# Patient Record
Sex: Male | Born: 1950 | Race: White | Hispanic: No | Marital: Married | Smoking: Never smoker
Health system: Southern US, Community
[De-identification: ages and names within clinical notes are randomized; demographics above are authoritative.]

## PROBLEM LIST (undated history)

## (undated) DIAGNOSIS — I2510 Atherosclerotic heart disease of native coronary artery without angina pectoris: Secondary | ICD-10-CM

## (undated) DIAGNOSIS — I10 Essential (primary) hypertension: Secondary | ICD-10-CM

## (undated) DIAGNOSIS — E7800 Pure hypercholesterolemia, unspecified: Secondary | ICD-10-CM

## (undated) DIAGNOSIS — Z8249 Family history of ischemic heart disease and other diseases of the circulatory system: Secondary | ICD-10-CM

## (undated) DIAGNOSIS — Z9289 Personal history of other medical treatment: Secondary | ICD-10-CM

## (undated) HISTORY — DX: Family history of ischemic heart disease and other diseases of the circulatory system: Z82.49

## (undated) HISTORY — DX: Personal history of other medical treatment: Z92.89

---

## 2000-12-01 ENCOUNTER — Encounter: Payer: Self-pay | Admitting: Family Medicine

## 2000-12-01 ENCOUNTER — Encounter: Admission: RE | Admit: 2000-12-01 | Discharge: 2000-12-01 | Payer: Self-pay | Admitting: Family Medicine

## 2003-10-04 ENCOUNTER — Ambulatory Visit (HOSPITAL_COMMUNITY): Admission: RE | Admit: 2003-10-04 | Discharge: 2003-10-04 | Payer: Self-pay | Admitting: Gastroenterology

## 2003-10-04 ENCOUNTER — Encounter (INDEPENDENT_AMBULATORY_CARE_PROVIDER_SITE_OTHER): Payer: Self-pay | Admitting: Specialist

## 2004-11-23 ENCOUNTER — Encounter: Admission: RE | Admit: 2004-11-23 | Discharge: 2004-11-23 | Payer: Self-pay | Admitting: Family Medicine

## 2006-06-07 HISTORY — PX: KNEE SURGERY: SHX244

## 2009-10-01 ENCOUNTER — Inpatient Hospital Stay (HOSPITAL_COMMUNITY): Admission: EM | Admit: 2009-10-01 | Discharge: 2009-10-09 | Payer: Self-pay | Admitting: Emergency Medicine

## 2009-10-01 DIAGNOSIS — Z9289 Personal history of other medical treatment: Secondary | ICD-10-CM

## 2009-10-01 HISTORY — DX: Personal history of other medical treatment: Z92.89

## 2009-10-02 ENCOUNTER — Ambulatory Visit: Payer: Self-pay | Admitting: Thoracic Surgery (Cardiothoracic Vascular Surgery)

## 2009-10-02 ENCOUNTER — Encounter: Payer: Self-pay | Admitting: Thoracic Surgery (Cardiothoracic Vascular Surgery)

## 2009-10-03 ENCOUNTER — Encounter: Payer: Self-pay | Admitting: Thoracic Surgery (Cardiothoracic Vascular Surgery)

## 2009-10-04 ENCOUNTER — Encounter: Payer: Self-pay | Admitting: Thoracic Surgery (Cardiothoracic Vascular Surgery)

## 2009-10-04 HISTORY — PX: TRANSTHORACIC ECHOCARDIOGRAM: SHX275

## 2009-10-04 HISTORY — PX: CORONARY ARTERY BYPASS GRAFT: SHX141

## 2009-10-14 ENCOUNTER — Ambulatory Visit: Payer: Self-pay | Admitting: Thoracic Surgery (Cardiothoracic Vascular Surgery)

## 2009-10-20 ENCOUNTER — Ambulatory Visit: Payer: Self-pay | Admitting: Thoracic Surgery (Cardiothoracic Vascular Surgery)

## 2009-10-20 ENCOUNTER — Encounter
Admission: RE | Admit: 2009-10-20 | Discharge: 2009-10-20 | Payer: Self-pay | Admitting: Thoracic Surgery (Cardiothoracic Vascular Surgery)

## 2009-10-23 ENCOUNTER — Encounter (HOSPITAL_COMMUNITY): Admission: RE | Admit: 2009-10-23 | Discharge: 2010-01-21 | Payer: Self-pay | Admitting: Cardiology

## 2009-11-10 ENCOUNTER — Encounter
Admission: RE | Admit: 2009-11-10 | Discharge: 2009-11-10 | Payer: Self-pay | Admitting: Thoracic Surgery (Cardiothoracic Vascular Surgery)

## 2009-11-10 ENCOUNTER — Ambulatory Visit: Payer: Self-pay | Admitting: Thoracic Surgery (Cardiothoracic Vascular Surgery)

## 2010-01-22 ENCOUNTER — Encounter (HOSPITAL_COMMUNITY): Admission: RE | Admit: 2010-01-22 | Discharge: 2010-02-05 | Payer: Self-pay | Admitting: Cardiology

## 2010-07-13 ENCOUNTER — Other Ambulatory Visit: Payer: Self-pay | Admitting: Dermatology

## 2010-08-25 LAB — HEMOGLOBIN A1C
Hgb A1c MFr Bld: 5.9 % — ABNORMAL HIGH (ref <5.7)
Mean Plasma Glucose: 123 mg/dL — ABNORMAL HIGH (ref <117)

## 2010-08-25 LAB — GLUCOSE, CAPILLARY
Glucose-Capillary: 103 mg/dL — ABNORMAL HIGH (ref 70–99)
Glucose-Capillary: 107 mg/dL — ABNORMAL HIGH (ref 70–99)
Glucose-Capillary: 110 mg/dL — ABNORMAL HIGH (ref 70–99)
Glucose-Capillary: 114 mg/dL — ABNORMAL HIGH (ref 70–99)
Glucose-Capillary: 115 mg/dL — ABNORMAL HIGH (ref 70–99)
Glucose-Capillary: 126 mg/dL — ABNORMAL HIGH (ref 70–99)
Glucose-Capillary: 128 mg/dL — ABNORMAL HIGH (ref 70–99)
Glucose-Capillary: 129 mg/dL — ABNORMAL HIGH (ref 70–99)
Glucose-Capillary: 135 mg/dL — ABNORMAL HIGH (ref 70–99)
Glucose-Capillary: 143 mg/dL — ABNORMAL HIGH (ref 70–99)
Glucose-Capillary: 149 mg/dL — ABNORMAL HIGH (ref 70–99)
Glucose-Capillary: 122 mg/dL — ABNORMAL HIGH (ref 70–99)
Glucose-Capillary: 135 mg/dL — ABNORMAL HIGH (ref 70–99)
Glucose-Capillary: 145 mg/dL — ABNORMAL HIGH (ref 70–99)

## 2010-08-25 LAB — BLOOD GAS, ARTERIAL
Acid-Base Excess: 2.4 mmol/L — ABNORMAL HIGH (ref 0.0–2.0)
Bicarbonate: 26.7 mEq/L — ABNORMAL HIGH (ref 20.0–24.0)
FIO2: 0.21 %
O2 Saturation: 96.7 %
Patient temperature: 98.6
TCO2: 28 mmol/L (ref 0–100)
pCO2 arterial: 43.3 mmHg (ref 35.0–45.0)
pH, Arterial: 7.407 (ref 7.350–7.450)
pO2, Arterial: 78.5 mmHg — ABNORMAL LOW (ref 80.0–100.0)

## 2010-08-25 LAB — BASIC METABOLIC PANEL
BUN: 10 mg/dL (ref 6–23)
BUN: 17 mg/dL (ref 6–23)
BUN: 17 mg/dL (ref 6–23)
BUN: 16 mg/dL (ref 6–23)
BUN: 17 mg/dL (ref 6–23)
CO2: 27 mEq/L (ref 19–32)
CO2: 32 mEq/L (ref 19–32)
CO2: 32 mEq/L (ref 19–32)
CO2: 30 mEq/L (ref 19–32)
CO2: 30 mEq/L (ref 19–32)
Calcium: 7.5 mg/dL — ABNORMAL LOW (ref 8.4–10.5)
Calcium: 8.2 mg/dL — ABNORMAL LOW (ref 8.4–10.5)
Calcium: 8.3 mg/dL — ABNORMAL LOW (ref 8.4–10.5)
Calcium: 8.9 mg/dL (ref 8.4–10.5)
Calcium: 9.4 mg/dL (ref 8.4–10.5)
Chloride: 101 mEq/L (ref 96–112)
Chloride: 104 mEq/L (ref 96–112)
Chloride: 105 mEq/L (ref 96–112)
Chloride: 102 mEq/L (ref 96–112)
Chloride: 104 mEq/L (ref 96–112)
Creatinine, Ser: 0.84 mg/dL (ref 0.4–1.5)
Creatinine, Ser: 0.91 mg/dL (ref 0.4–1.5)
Creatinine, Ser: 0.93 mg/dL (ref 0.4–1.5)
Creatinine, Ser: 0.95 mg/dL (ref 0.4–1.5)
Creatinine, Ser: 1 mg/dL (ref 0.4–1.5)
GFR calc Af Amer: >60 mL/min (ref >60)
GFR calc Af Amer: >60 mL/min (ref >60)
GFR calc Af Amer: >60 mL/min (ref >60)
GFR calc Af Amer: >60 mL/min (ref >60)
GFR calc Af Amer: >60 mL/min (ref >60)
GFR calc non Af Amer: >60 mL/min (ref >60)
GFR calc non Af Amer: >60 mL/min (ref >60)
GFR calc non Af Amer: >60 mL/min (ref >60)
GFR calc non Af Amer: >60 mL/min (ref >60)
GFR calc non Af Amer: >60 mL/min (ref >60)
Glucose, Bld: 108 mg/dL — ABNORMAL HIGH (ref 70–99)
Glucose, Bld: 114 mg/dL — ABNORMAL HIGH (ref 70–99)
Glucose, Bld: 161 mg/dL — ABNORMAL HIGH (ref 70–99)
Glucose, Bld: 104 mg/dL — ABNORMAL HIGH (ref 70–99)
Glucose, Bld: 108 mg/dL — ABNORMAL HIGH (ref 70–99)
Potassium: 4.1 mEq/L (ref 3.5–5.1)
Potassium: 4.3 mEq/L (ref 3.5–5.1)
Potassium: 4.6 mEq/L (ref 3.5–5.1)
Potassium: 3.4 mEq/L — ABNORMAL LOW (ref 3.5–5.1)
Potassium: 4 mEq/L (ref 3.5–5.1)
Sodium: 135 mEq/L (ref 135–145)
Sodium: 137 mEq/L (ref 135–145)
Sodium: 140 mEq/L (ref 135–145)
Sodium: 139 mEq/L (ref 135–145)
Sodium: 139 mEq/L (ref 135–145)

## 2010-08-25 LAB — CBC
HCT: 22.1 % — ABNORMAL LOW (ref 39.0–52.0)
HCT: 22.5 % — ABNORMAL LOW (ref 39.0–52.0)
HCT: 22.7 % — ABNORMAL LOW (ref 39.0–52.0)
HCT: 25 % — ABNORMAL LOW (ref 39.0–52.0)
HCT: 16.5 % — ABNORMAL LOW (ref 39.0–52.0)
HCT: 22.3 % — ABNORMAL LOW (ref 39.0–52.0)
HCT: 37.4 % — ABNORMAL LOW (ref 39.0–52.0)
HCT: 39.6 % (ref 39.0–52.0)
Hemoglobin: 7.8 g/dL — ABNORMAL LOW (ref 13.0–17.0)
Hemoglobin: 7.8 g/dL — ABNORMAL LOW (ref 13.0–17.0)
Hemoglobin: 7.9 g/dL — ABNORMAL LOW (ref 13.0–17.0)
Hemoglobin: 8.7 g/dL — ABNORMAL LOW (ref 13.0–17.0)
Hemoglobin: 13.1 g/dL (ref 13.0–17.0)
Hemoglobin: 13.9 g/dL (ref 13.0–17.0)
Hemoglobin: 5.9 g/dL — CL (ref 13.0–17.0)
Hemoglobin: 7.7 g/dL — ABNORMAL LOW (ref 13.0–17.0)
MCHC: 34.5 g/dL (ref 30.0–36.0)
MCHC: 34.7 g/dL (ref 30.0–36.0)
MCHC: 35.1 g/dL (ref 30.0–36.0)
MCHC: 35.3 g/dL (ref 30.0–36.0)
MCHC: 34.5 g/dL (ref 30.0–36.0)
MCHC: 35 g/dL (ref 30.0–36.0)
MCHC: 35.2 g/dL (ref 30.0–36.0)
MCHC: 35.7 g/dL (ref 30.0–36.0)
MCV: 90.8 fL (ref 78.0–100.0)
MCV: 91.8 fL (ref 78.0–100.0)
MCV: 92.2 fL (ref 78.0–100.0)
MCV: 92.5 fL (ref 78.0–100.0)
MCV: 91.7 fL (ref 78.0–100.0)
MCV: 93.2 fL (ref 78.0–100.0)
MCV: 93.9 fL (ref 78.0–100.0)
MCV: 94 fL (ref 78.0–100.0)
Platelets: 126 10*3/uL — ABNORMAL LOW (ref 150–400)
Platelets: 135 10*3/uL — ABNORMAL LOW (ref 150–400)
Platelets: 144 10*3/uL — ABNORMAL LOW (ref 150–400)
Platelets: 151 10*3/uL (ref 150–400)
Platelets: 136 10*3/uL — ABNORMAL LOW (ref 150–400)
Platelets: 197 10*3/uL (ref 150–400)
Platelets: 222 10*3/uL (ref 150–400)
Platelets: 84 10*3/uL — ABNORMAL LOW (ref 150–400)
RBC: 2.44 MIL/uL — ABNORMAL LOW (ref 4.22–5.81)
RBC: 2.44 MIL/uL — ABNORMAL LOW (ref 4.22–5.81)
RBC: 2.45 MIL/uL — ABNORMAL LOW (ref 4.22–5.81)
RBC: 2.73 MIL/uL — ABNORMAL LOW (ref 4.22–5.81)
RBC: 1.77 MIL/uL — ABNORMAL LOW (ref 4.22–5.81)
RBC: 2.44 MIL/uL — ABNORMAL LOW (ref 4.22–5.81)
RBC: 3.97 MIL/uL — ABNORMAL LOW (ref 4.22–5.81)
RBC: 4.22 MIL/uL (ref 4.22–5.81)
RDW: 14.2 % (ref 11.5–15.5)
RDW: 14.2 % (ref 11.5–15.5)
RDW: 14.3 % (ref 11.5–15.5)
RDW: 14.3 % (ref 11.5–15.5)
RDW: 12.3 % (ref 11.5–15.5)
RDW: 12.5 % (ref 11.5–15.5)
RDW: 12.6 % (ref 11.5–15.5)
RDW: 14.1 % (ref 11.5–15.5)
WBC: 11.7 10*3/uL — ABNORMAL HIGH (ref 4.0–10.5)
WBC: 11.8 10*3/uL — ABNORMAL HIGH (ref 4.0–10.5)
WBC: 12.1 10*3/uL — ABNORMAL HIGH (ref 4.0–10.5)
WBC: 14.8 10*3/uL — ABNORMAL HIGH (ref 4.0–10.5)
WBC: 7.2 10*3/uL (ref 4.0–10.5)
WBC: 8.2 10*3/uL (ref 4.0–10.5)
WBC: 8.3 10*3/uL (ref 4.0–10.5)
WBC: 8.5 10*3/uL (ref 4.0–10.5)

## 2010-08-25 LAB — POCT I-STAT 3, ART BLOOD GAS (G3+)
Acid-Base Excess: 2 mmol/L (ref 0.0–2.0)
Acid-Base Excess: 3 mmol/L — ABNORMAL HIGH (ref 0.0–2.0)
Acid-base deficit: 3 mmol/L — ABNORMAL HIGH (ref 0.0–2.0)
Bicarbonate: 22.2 mEq/L (ref 20.0–24.0)
Bicarbonate: 24.4 mEq/L — ABNORMAL HIGH (ref 20.0–24.0)
Bicarbonate: 24.8 mEq/L — ABNORMAL HIGH (ref 20.0–24.0)
Bicarbonate: 26.1 mEq/L — ABNORMAL HIGH (ref 20.0–24.0)
Bicarbonate: 27.5 mEq/L — ABNORMAL HIGH (ref 20.0–24.0)
O2 Saturation: 100 %
O2 Saturation: 100 %
O2 Saturation: 100 %
O2 Saturation: 95 %
O2 Saturation: 99 %
Patient temperature: 34.1
Patient temperature: 37.1
Patient temperature: 99
TCO2: 23 mmol/L (ref 0–100)
TCO2: 26 mmol/L (ref 0–100)
TCO2: 26 mmol/L (ref 0–100)
TCO2: 27 mmol/L (ref 0–100)
TCO2: 29 mmol/L (ref 0–100)
pCO2 arterial: 34.7 mmHg — ABNORMAL LOW (ref 35.0–45.0)
pCO2 arterial: 38.2 mmHg (ref 35.0–45.0)
pCO2 arterial: 38.7 mmHg (ref 35.0–45.0)
pCO2 arterial: 40.4 mmHg (ref 35.0–45.0)
pCO2 arterial: 44.3 mmHg (ref 35.0–45.0)
pH, Arterial: 7.366 (ref 7.350–7.450)
pH, Arterial: 7.401 (ref 7.350–7.450)
pH, Arterial: 7.415 (ref 7.350–7.450)
pH, Arterial: 7.419 (ref 7.350–7.450)
pH, Arterial: 7.449 (ref 7.350–7.450)
pO2, Arterial: 150 mmHg — ABNORMAL HIGH (ref 80.0–100.0)
pO2, Arterial: 159 mmHg — ABNORMAL HIGH (ref 80.0–100.0)
pO2, Arterial: 185 mmHg — ABNORMAL HIGH (ref 80.0–100.0)
pO2, Arterial: 293 mmHg — ABNORMAL HIGH (ref 80.0–100.0)
pO2, Arterial: 80 mmHg (ref 80.0–100.0)

## 2010-08-25 LAB — POCT I-STAT 4, (NA,K, GLUC, HGB,HCT)
Glucose, Bld: 105 mg/dL — ABNORMAL HIGH (ref 70–99)
Glucose, Bld: 105 mg/dL — ABNORMAL HIGH (ref 70–99)
Glucose, Bld: 110 mg/dL — ABNORMAL HIGH (ref 70–99)
Glucose, Bld: 111 mg/dL — ABNORMAL HIGH (ref 70–99)
Glucose, Bld: 119 mg/dL — ABNORMAL HIGH (ref 70–99)
Glucose, Bld: 74 mg/dL (ref 70–99)
Glucose, Bld: 76 mg/dL (ref 70–99)
Glucose, Bld: 96 mg/dL (ref 70–99)
HCT: 14 % — ABNORMAL LOW (ref 39.0–52.0)
HCT: 15 % — ABNORMAL LOW (ref 39.0–52.0)
HCT: 23 % — ABNORMAL LOW (ref 39.0–52.0)
HCT: 26 % — ABNORMAL LOW (ref 39.0–52.0)
HCT: 26 % — ABNORMAL LOW (ref 39.0–52.0)
HCT: 26 % — ABNORMAL LOW (ref 39.0–52.0)
HCT: 31 % — ABNORMAL LOW (ref 39.0–52.0)
HCT: 34 % — ABNORMAL LOW (ref 39.0–52.0)
Hemoglobin: 10.5 g/dL — ABNORMAL LOW (ref 13.0–17.0)
Hemoglobin: 11.6 g/dL — ABNORMAL LOW (ref 13.0–17.0)
Hemoglobin: 4.8 g/dL — CL (ref 13.0–17.0)
Hemoglobin: 5.1 g/dL — CL (ref 13.0–17.0)
Hemoglobin: 7.8 g/dL — ABNORMAL LOW (ref 13.0–17.0)
Hemoglobin: 8.8 g/dL — ABNORMAL LOW (ref 13.0–17.0)
Hemoglobin: 8.8 g/dL — ABNORMAL LOW (ref 13.0–17.0)
Hemoglobin: 8.8 g/dL — ABNORMAL LOW (ref 13.0–17.0)
Potassium: 3.7 mEq/L (ref 3.5–5.1)
Potassium: 3.7 mEq/L (ref 3.5–5.1)
Potassium: 4 mEq/L (ref 3.5–5.1)
Potassium: 4.1 mEq/L (ref 3.5–5.1)
Potassium: 4.3 mEq/L (ref 3.5–5.1)
Potassium: 4.4 mEq/L (ref 3.5–5.1)
Potassium: 5.3 mEq/L — ABNORMAL HIGH (ref 3.5–5.1)
Potassium: 6 mEq/L — ABNORMAL HIGH (ref 3.5–5.1)
Sodium: 133 mEq/L — ABNORMAL LOW (ref 135–145)
Sodium: 134 mEq/L — ABNORMAL LOW (ref 135–145)
Sodium: 136 mEq/L (ref 135–145)
Sodium: 138 mEq/L (ref 135–145)
Sodium: 139 mEq/L (ref 135–145)
Sodium: 140 mEq/L (ref 135–145)
Sodium: 143 mEq/L (ref 135–145)
Sodium: 143 mEq/L (ref 135–145)

## 2010-08-25 LAB — POCT I-STAT, CHEM 8
BUN: 15 mg/dL (ref 6–23)
BUN: 10 mg/dL (ref 6–23)
Calcium, Ion: 0.93 mmol/L — ABNORMAL LOW (ref 1.12–1.32)
Calcium, Ion: 1.11 mmol/L — ABNORMAL LOW (ref 1.12–1.32)
Chloride: 103 mEq/L (ref 96–112)
Chloride: 106 mEq/L (ref 96–112)
Creatinine, Ser: 0.8 mg/dL (ref 0.4–1.5)
Creatinine, Ser: 0.8 mg/dL (ref 0.4–1.5)
Glucose, Bld: 140 mg/dL — ABNORMAL HIGH (ref 70–99)
Glucose, Bld: 135 mg/dL — ABNORMAL HIGH (ref 70–99)
HCT: 26 % — ABNORMAL LOW (ref 39.0–52.0)
HCT: 22 % — ABNORMAL LOW (ref 39.0–52.0)
Hemoglobin: 8.8 g/dL — ABNORMAL LOW (ref 13.0–17.0)
Hemoglobin: 7.5 g/dL — ABNORMAL LOW (ref 13.0–17.0)
Potassium: 5 mEq/L (ref 3.5–5.1)
Potassium: 4 mEq/L (ref 3.5–5.1)
Sodium: 140 mEq/L (ref 135–145)
Sodium: 142 mEq/L (ref 135–145)
TCO2: 26 mmol/L (ref 0–100)
TCO2: 27 mmol/L (ref 0–100)

## 2010-08-25 LAB — PLATELET COUNT: Platelets: 155 10*3/uL (ref 150–400)

## 2010-08-25 LAB — DIFFERENTIAL
Basophils Absolute: 0 10*3/uL (ref 0.0–0.1)
Basophils Relative: 0 % (ref 0–1)
Eosinophils Absolute: 0.1 10*3/uL (ref 0.0–0.7)
Eosinophils Relative: 1 % (ref 0–5)
Lymphocytes Relative: 15 % (ref 12–46)
Lymphs Abs: 1.2 10*3/uL (ref 0.7–4.0)
Monocytes Absolute: 0.5 10*3/uL (ref 0.1–1.0)
Monocytes Relative: 6 % (ref 3–12)
Neutro Abs: 6.4 10*3/uL (ref 1.7–7.7)
Neutrophils Relative %: 78 % — ABNORMAL HIGH (ref 43–77)

## 2010-08-25 LAB — ABO/RH: ABO/RH(D): O NEG

## 2010-08-25 LAB — PREPARE FRESH FROZEN PLASMA

## 2010-08-25 LAB — HEPARIN LEVEL (UNFRACTIONATED): Heparin Unfractionated: 0.13 IU/mL — ABNORMAL LOW (ref 0.30–0.70)

## 2010-08-25 LAB — PREPARE PLATELETS

## 2010-08-25 LAB — MRSA PCR SCREENING: MRSA by PCR: NEGATIVE

## 2010-08-25 LAB — COMPREHENSIVE METABOLIC PANEL
ALT: 32 U/L (ref 0–53)
AST: 25 U/L (ref 0–37)
Albumin: 3.8 g/dL (ref 3.5–5.2)
Alkaline Phosphatase: 66 U/L (ref 39–117)
BUN: 16 mg/dL (ref 6–23)
CO2: 27 mEq/L (ref 19–32)
Calcium: 9.3 mg/dL (ref 8.4–10.5)
Chloride: 103 mEq/L (ref 96–112)
Creatinine, Ser: 0.99 mg/dL (ref 0.4–1.5)
GFR calc Af Amer: >60 mL/min (ref >60)
GFR calc non Af Amer: >60 mL/min (ref >60)
Glucose, Bld: 98 mg/dL (ref 70–99)
Potassium: 4 mEq/L (ref 3.5–5.1)
Sodium: 136 mEq/L (ref 135–145)
Total Bilirubin: 0.8 mg/dL (ref 0.3–1.2)
Total Protein: 6.9 g/dL (ref 6.0–8.3)

## 2010-08-25 LAB — TYPE AND SCREEN
ABO/RH(D): O NEG
Antibody Screen: NEGATIVE

## 2010-08-25 LAB — PROTIME-INR
INR: 0.95 (ref 0.00–1.49)
INR: 1.67 — ABNORMAL HIGH (ref 0.00–1.49)
Prothrombin Time: 12.6 seconds (ref 11.6–15.2)
Prothrombin Time: 19.6 seconds — ABNORMAL HIGH (ref 11.6–15.2)

## 2010-08-25 LAB — LIPID PANEL
Cholesterol: 164 mg/dL (ref 0–200)
HDL: 36 mg/dL — ABNORMAL LOW (ref >39)
LDL Cholesterol: 107 mg/dL — ABNORMAL HIGH (ref 0–99)
Total CHOL/HDL Ratio: 4.6 RATIO
Triglycerides: 105 mg/dL (ref <150)
VLDL: 21 mg/dL (ref 0–40)

## 2010-08-25 LAB — MAGNESIUM
Magnesium: 2.3 mg/dL (ref 1.5–2.5)
Magnesium: 2.5 mg/dL (ref 1.5–2.5)
Magnesium: 2.1 mg/dL (ref 1.5–2.5)
Magnesium: 2.6 mg/dL — ABNORMAL HIGH (ref 1.5–2.5)

## 2010-08-25 LAB — CREATININE, SERUM
Creatinine, Ser: 0.91 mg/dL (ref 0.4–1.5)
Creatinine, Ser: 0.82 mg/dL (ref 0.4–1.5)
GFR calc Af Amer: >60 mL/min (ref >60)
GFR calc Af Amer: >60 mL/min (ref >60)
GFR calc non Af Amer: >60 mL/min (ref >60)
GFR calc non Af Amer: >60 mL/min (ref >60)

## 2010-08-25 LAB — PREPARE RBC (CROSSMATCH)

## 2010-08-25 LAB — CARDIAC PANEL(CRET KIN+CKTOT+MB+TROPI)
CK, MB: 3 ng/mL (ref 0.3–4.0)
Relative Index: 2.6 — ABNORMAL HIGH (ref 0.0–2.5)
Total CK: 114 U/L (ref 7–232)
Troponin I: 0.01 ng/mL (ref 0.00–0.06)

## 2010-08-25 LAB — CK TOTAL AND CKMB (NOT AT ARMC)
CK, MB: 4.3 ng/mL — ABNORMAL HIGH (ref 0.3–4.0)
Relative Index: 2.8 — ABNORMAL HIGH (ref 0.0–2.5)
Total CK: 155 U/L (ref 7–232)

## 2010-08-25 LAB — HEMOGLOBIN AND HEMATOCRIT, BLOOD
HCT: 25.1 % — ABNORMAL LOW (ref 39.0–52.0)
Hemoglobin: 8.8 g/dL — ABNORMAL LOW (ref 13.0–17.0)

## 2010-08-25 LAB — APTT
aPTT: 27 seconds (ref 24–37)
aPTT: 44 seconds — ABNORMAL HIGH (ref 24–37)

## 2010-08-25 LAB — TSH: TSH: 0.595 u[IU]/mL (ref 0.350–4.500)

## 2010-08-25 LAB — TROPONIN I: Troponin I: 0.01 ng/mL (ref 0.00–0.06)

## 2010-10-20 NOTE — Assessment & Plan Note (Signed)
OFFICE VISIT   Patrick Stout, Patrick Stout  DOB:  05/01/1951                                        Oct 14, 2009  CHART #:  [id number]   REASON FOR OFFICE VISIT:  Swelling of the right thigh and lower  extremity.   HISTORY OF PRESENT ILLNESS:  This is a pleasant 60 year old Caucasian  male who is status post CABG x5 on October 04, 2009.  The patient had a  fairly uneventful postoperative course and he was discharged in stable  condition on Oct 09, 2009.  The patient's complaints include occasional  shortness of breath with increasing activity as well as swelling of his  right thigh and lower extremity.  The patient denies any fever, chills,  or chest pain.   PHYSICAL EXAMINATION:  General:  This is a pleasant 60 year old  Caucasian male who is accompanied by his wife, who is in no acute  distress, who is alert, oriented, and cooperative.  Vital Signs:  His  vital signs are as follows.  BP 123/70, pulse rate 84, respirations 18,  O2 sat 96% on room air, and temperature 97.7.  Cardiovascular:  Regular  rate and rhythm.  No murmurs, gallops, or rubs.  Pulmonary:  Right lung  clear, slowly decrease at the left base.  No rales, wheezes, or rhonchi.  Abdomen:  Soft and nontender.  Bowel sounds present.  Extremities:  Positive edema, right lower extremity.  No edema, left lower extremity.  Wounds:  Sternum is clean, dry, and continuing to heal.  No signs of  infection.  Right lower extremity wound is clean and dry.  Right thigh  positive for swelling and tenderness, slight warmth.  Right calf,  tenderness at saphenous harvest area.  Some ecchymosis around the right  knee and proximally.  Slight erythema of the right thigh.  The right  thigh is warm to the touch and the left inner thigh.   IMPRESSION AND PLAN:  Hematoma of the right thigh and lower extremity.  I am going to give a prescription to the patient take Augmentin 875 mg 1  tablet p.o. 2 times daily for 10 days.  He  was encouraged to keep his  right lower extremity elevated as much as possible.  He was encouraged  to finish his Lasix and potassium.  The patient states that he has been  losing approximately 2 pounds per day.  The patient encouraged to keep a  daily record of his weight and to notify the office if he has any  increased swelling or shortness of breath.  He was also instructed that  if he develops any increased tenderness, erythema, fever, chills, or has  difficulty ambulating (the patient is able to ambulate fairly well at  this time), he is to contact the office immediately.  Otherwise, he will  be seen in the office in followup in 1 week by Dr. Cornelius Moras.   Salvatore Decent. Cornelius Moras, M.D.  Electronically Signed   DZ/MEDQ  D:  10/14/2009  T:  10/15/2009  Job:  [id number]   cc:   Ritta Slot, MD

## 2010-10-20 NOTE — Assessment & Plan Note (Signed)
OFFICE VISIT   DAYSHAWN, IRIZARRY  DOB:  1950/10/07                                        November 10, 2009  CHART #:  [id number]   ADDENDUM   Chest x-ray performed today at the [facility] is  reviewed.  This demonstrates mild residual left basilar plate-like  atelectasis.  The lung fields are otherwise clear.  Atelectasis and  pleural effusions present early after surgery have now virtually  completely resolved.  This chest x-ray looks good.  All of the sternal  wires appear intact.   Salvatore Decent. Cornelius Moras, M.D.  Electronically Signed   CHO/MEDQ  D:  11/10/2009  T:  11/11/2009  Job:  [id number]

## 2010-10-20 NOTE — Assessment & Plan Note (Signed)
OFFICE VISIT   DOMONIC, KIMBALL  DOB:  December 13, 1950                                        Oct 20, 2009  CHART #:  [id number]   HISTORY OF PRESENT ILLNESS:  The patient returns for further followup  status post coronary artery bypass grafting x5 on October 04, 2009.  He  was last seen here in the office on Oct 14, 2009 by one of our physician  assistants.  At that time, he was noted to have severe swelling and  erythema of his right lower extremity related to hematoma of the right  thigh and lower leg from his endoscopic saphenous vein procurement.  He  was started on oral Augmentin and kept on Lasix as a diuretic.  He  reports that over the last week, he has gotten dramatically better.  He  is feeling better and better every day.  He denies any fevers or chills.  His wife has been keeping track of his temperature and blood pressure,  and everything looks excellent.  Weight has continued to trend down  towards normal.  The size of swelling of his thigh and lower leg has  dramatically decreased.  He still has the associated tenderness.  Overall, he is very pleased with his progress and he has no new  complaints.  The remainder of his review of systems is unremarkable.   PHYSICAL EXAMINATION:  Notable for well-appearing male with blood  pressure 117/71, pulse 67, and oxygen saturation 96% on room air.  Examination of the chest is notable for median sternotomy incision that  is healing nicely.  The sternum is stable on palpation.  Breath sounds  are clear to auscultation and symmetrical bilaterally.  No wheezes,  rales, or rhonchi are noted.  Cardiovascular exam demonstrates regular  rate and rhythm.  The abdomen is soft and nontender.  The extremities  are warm and well perfused.  The right thigh and right lower leg are  still a little bit swollen, but apparently the amount of swelling is  dramatically decreased from last week.  There is firmness all long the  endoscopic vein harvest tract consistent with a hematoma.  At present,  there is no cellulitis and there is no tenderness to palpation.  There  is no fluctuance to suggest an undrained fluid collection.  No other  abnormalities are noted and the skin incisions remain intact.   IMPRESSION:  Resolving hematoma from his right thigh and right lower leg  endoscopic vein harvest.  The patient is otherwise doing very well.   PLAN:  I have encouraged the patient to continue to gradually increase  his physical activity.  He will complete his course of oral Augmentin  and Lasix as previously prescribed.  We have made no new recommendations  today.  We will get his followup chest x-ray today and plan to see him  back for further followup in 3 weeks.   Salvatore Decent. Cornelius Moras, M.D.  Electronically Signed   CHO/MEDQ  D:  10/20/2009  T:  10/21/2009  Job:  [id number]   cc:   Ritta Slot, MD  Marjory Lies, M.D.

## 2010-10-20 NOTE — Assessment & Plan Note (Signed)
OFFICE VISIT   Patrick Stout, Patrick Stout  DOB:  1950-11-20                                        November 10, 2009  CHART #:  [id number]   HISTORY:  The patient returns for routine followup status post coronary  artery bypass grafting x5 on October 04, 2009.  His postoperative recovery  has been uncomplicated.  He was last seen here in the office on Oct 20, 2009.  At that time, he did have a hematoma in his right thigh related  to his endoscopic vein harvest.  Since then,  he has continued to  improve.  He states that he now has only mild residual soreness and he  only occasionally uses Tylenol for pain.  He reports that his breathing  has continued to gradually improve.  He has been actively participating  in the cardiac rehab program and progressing nicely.  His appetite has  been improving.  Overall, he has no complaints.   PHYSICAL EXAMINATION:  Notable for well-appearing male with blood  pressure 118/74, pulse 73, and oxygen saturation 96% on room air.  Examination of the chest reveals clear breath sounds that are  symmetrical bilaterally.  No wheezes, rales, or rhonchi are noted.  Cardiovascular exam reveals regular rate and rhythm.  No murmurs, rubs,  or gallops are noted.  The sternum is stable and the incision is healing  nicely.  The abdomen is soft and nontender.  The extremities are warm  and well perfused.  The right lower extremity edema has resolved.  The  hematoma continues to gradually resolving.  There is no sign of  cellulitis or infection.   IMPRESSION:  The patient is progressing nicely.   PLAN:  We will check a routine chest x-ray today for followup.  I have  encouraged the patient to continue to gradually increase his physical  activity as tolerated with his only limitation at this point remaining  that he refrain from heavy lifting or strenuous use of his arms or  shoulders for at  least another 2-3 months.  All of his questions have been  addressed.  We  have not changed any of his medications.  In the future, he will call  and return to see [place] as needed.   Salvatore Decent. Cornelius Moras, M.D.  Electronically Signed   CHO/MEDQ  D:  11/10/2009  T:  11/11/2009  Job:  [id number]   cc:   Ritta Slot, MD  Marjory Lies, M.D.

## 2010-10-23 NOTE — Op Note (Signed)
NAMEBERYL, BALZ                          ACCOUNT NO.:  [id number]   MEDICAL RECORD NO.:  [id number]                   PATIENT TYPE:  AMB   LOCATION:  ENDO                                 FACILITY:  [facility]   PHYSICIAN:  Anselmo Rod, M.D.               DATE OF BIRTH:  1950-11-10   DATE OF PROCEDURE:  10/04/2003  DATE OF DISCHARGE:                                 OPERATIVE REPORT   PROCEDURE PERFORMED:  Colonoscopy with snare polypectomy times one.   ENDOSCOPIST:  Charna Elizabeth, M.D.   INSTRUMENT USED:  Olympus video colonoscope.   INDICATIONS FOR PROCEDURE:  The patient is a 60 year old white male with a  history of occasional bright red blood per rectum.  Rule out colonic polyps,  masses, etc.   PREPROCEDURE PREPARATION:  Informed consent was procured from the patient.  The patient was fasted for eight hours prior to the procedure and prepped  with a bottle of magnesium citrate and a gallon of GoLYTELY the night prior  to the procedure.   PREPROCEDURE PHYSICAL:  The patient had stable vital signs.  Neck supple.  Chest clear to auscultation.  S1 and S2 regular.  Abdomen soft with normal  bowel sounds.   DESCRIPTION OF PROCEDURE:  The patient was placed in left lateral decubitus  position and sedated with 100 mg of Demerol and 10 mg of Versed in slow  incremental doses.  Once the patient was adequately sedated and maintained  on low flow oxygen and continuous cardiac monitoring, the Olympus video  colonoscope was advanced from the rectum to the cecum and terminal ileum  without difficulty.  The patient had a fairly good prep.  A small sessile  polyp was snared from the rectum.  The rest of the colonic mucosa up to the  terminal ileum appeared normal.  The appendicular orifice and ileocecal  valve were clearly visualized and photographed.  There was no evidence of  diverticulosis.  The patient tolerated the procedure well without immediate  complication.   IMPRESSION:   Small sessile polyp snared from the rectum otherwise normal  colonoscopy up to the terminal ileum.   RECOMMENDATIONS:  1. Await pathology results.  2. Avoid all nonsteroidals including aspirin for the next two weeks.  3. Repeat CRC screening depending on pathology results.  4. Outpatient follow-up is rectal bleeding recurs.                                               Anselmo Rod, M.D.    JNM/MEDQ  D:  10/04/2003  T:  10/04/2003  Job:  [id number]   cc:   Marjory Lies, M.D.  [address]  [place]  [place] [address]  Fax: [phone]

## 2012-01-06 IMAGING — CR DG CHEST 1V PORT
1 series · 1 of 1 positions shown · non-contrast
Comparison: 10/05/2009

CLINICAL DATA: Acute coronary syndrome

PORTABLE CHEST - 1 VIEW

[view not recorded]
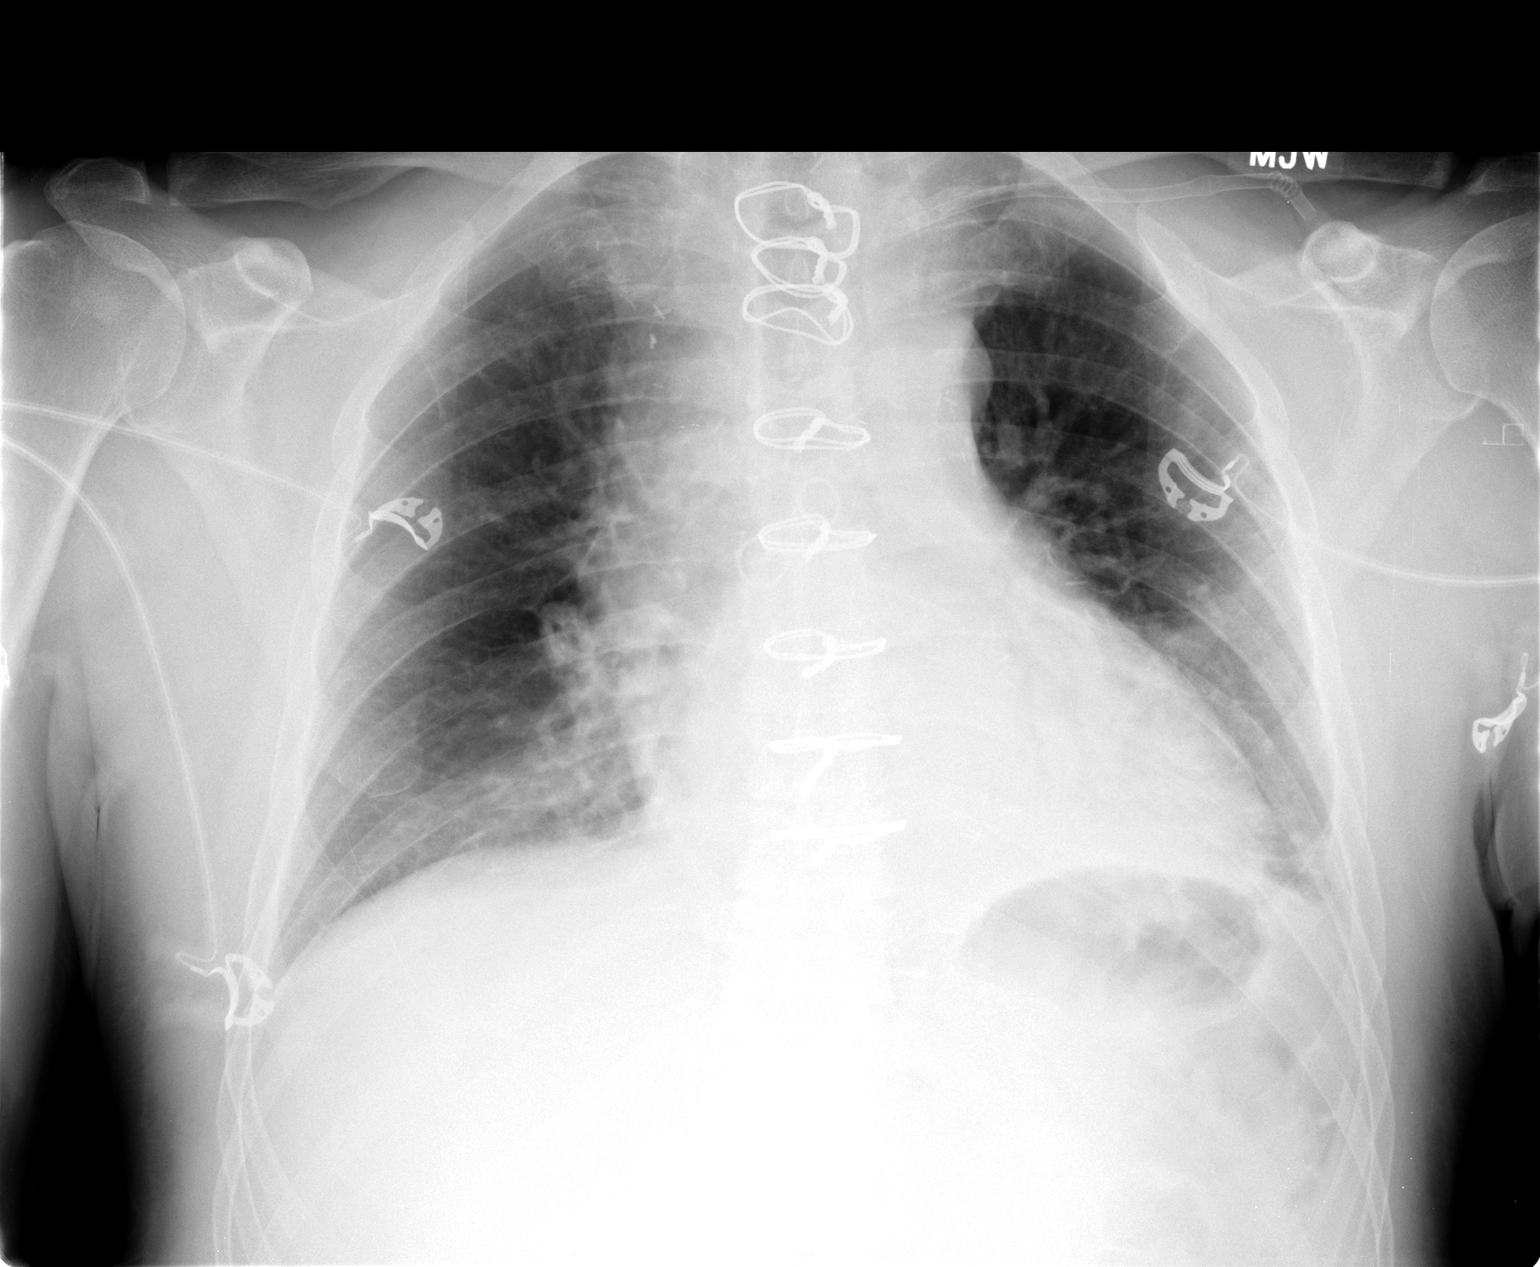

[1 of 1 positions shown; findings below may reference images not displayed]

FINDINGS: Cardiomediastinal silhouette is stable.  The patient is
status post CABG.  Right chest tube and left Swan-Ganz catheter has
been removed.  Trace left pleural effusion with left basilar
atelectasis or infiltrate.  Minimal central vascular congestion
without pulmonary edema.  No segmental infiltrate.  No diagnostic
pneumothorax.  There is  left subclavian catheter sheath in place.
IMPRESSION: Right chest tube and left Swan-Ganz catheter has been removed.
Trace left pleural effusion with left basilar atelectasis or
infiltrate.  Minimal central vascular congestion without pulmonary
edema.  No segmental infiltrate.  No diagnostic pneumothorax.
There is  left subclavian catheter sheath in place.

## 2012-01-07 IMAGING — CR DG CHEST 2V
2 series · 2 of 2 positions shown · non-contrast
Comparison: 10/06/2009.

CLINICAL DATA: CABG.

CHEST - 2 VIEW

[w chest pa]
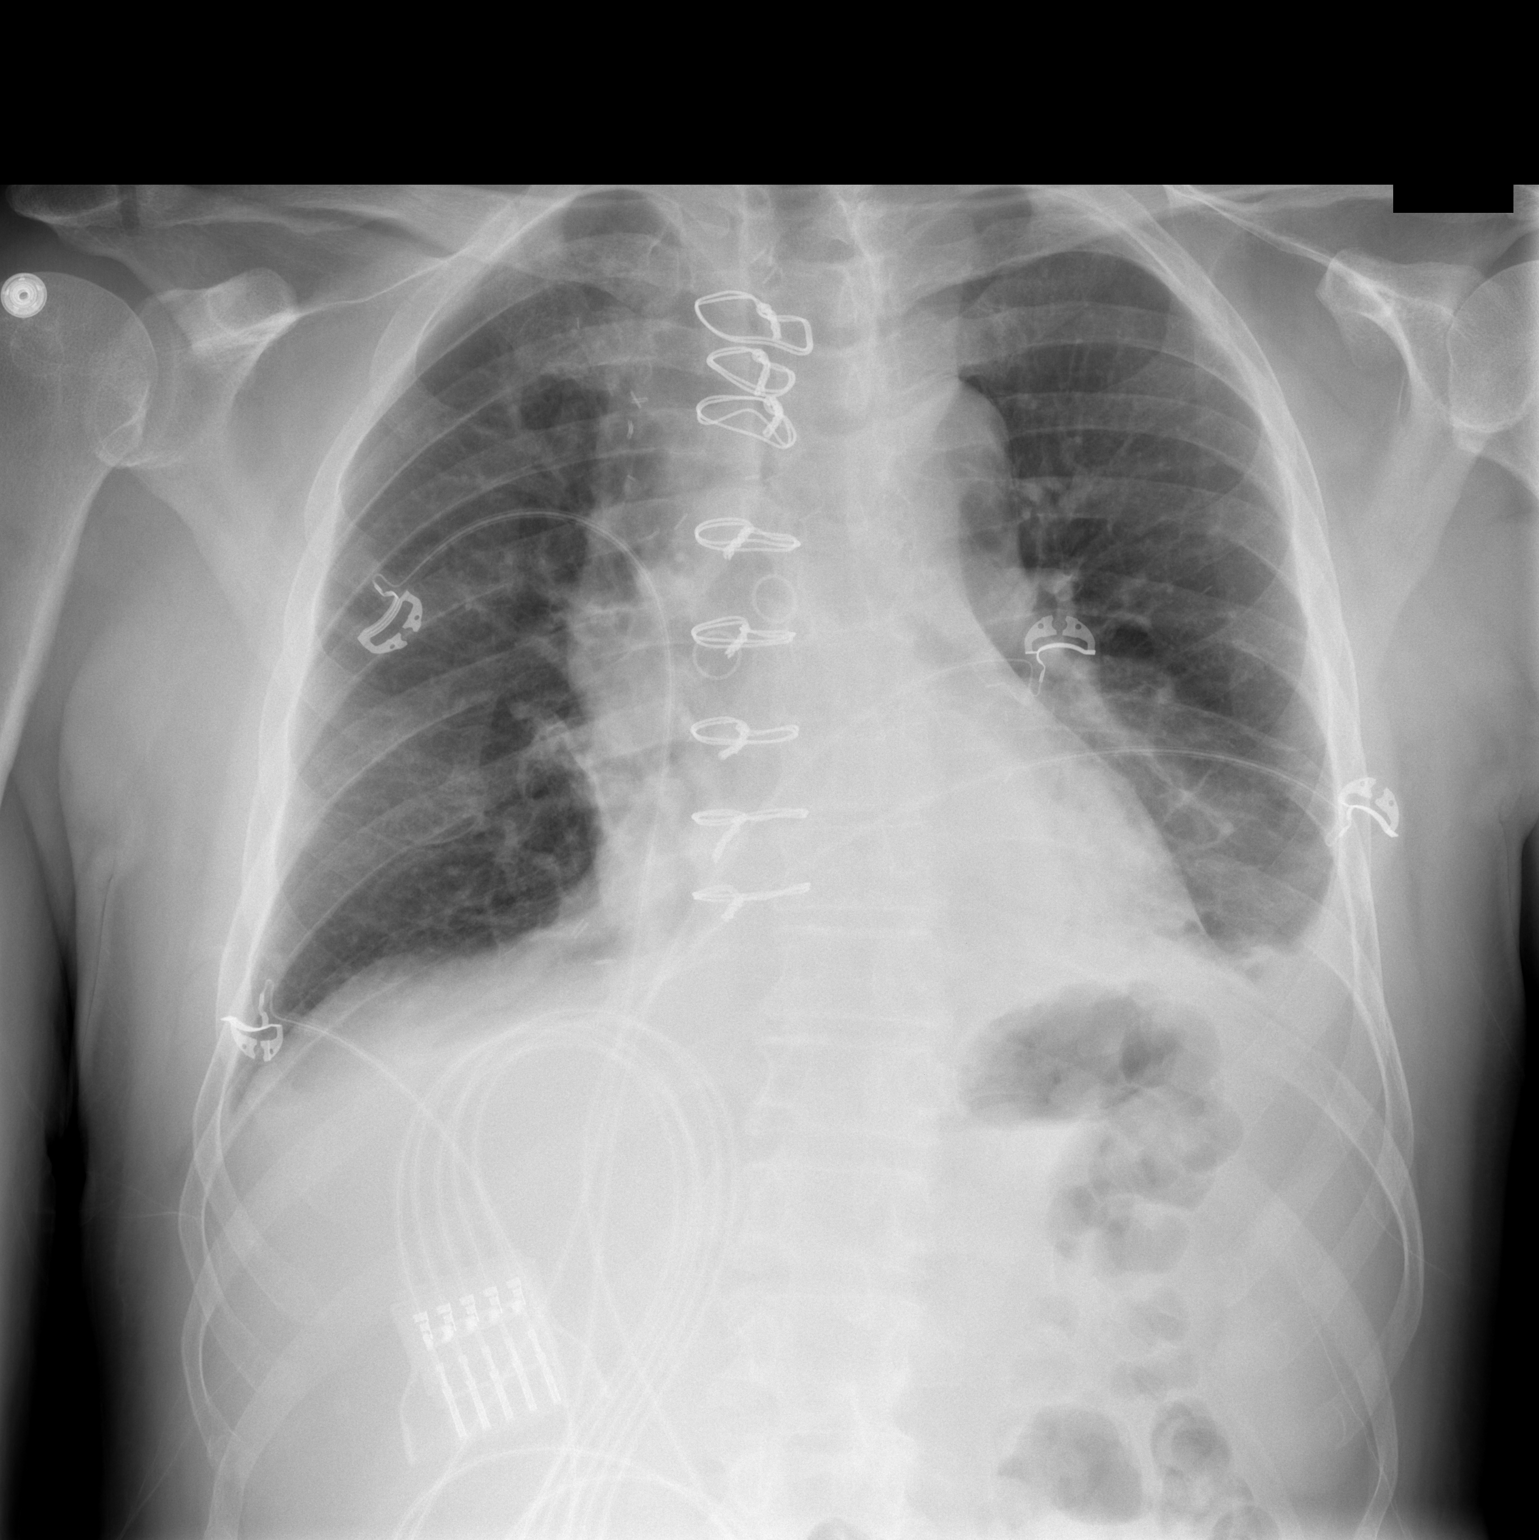

[w chest lat]
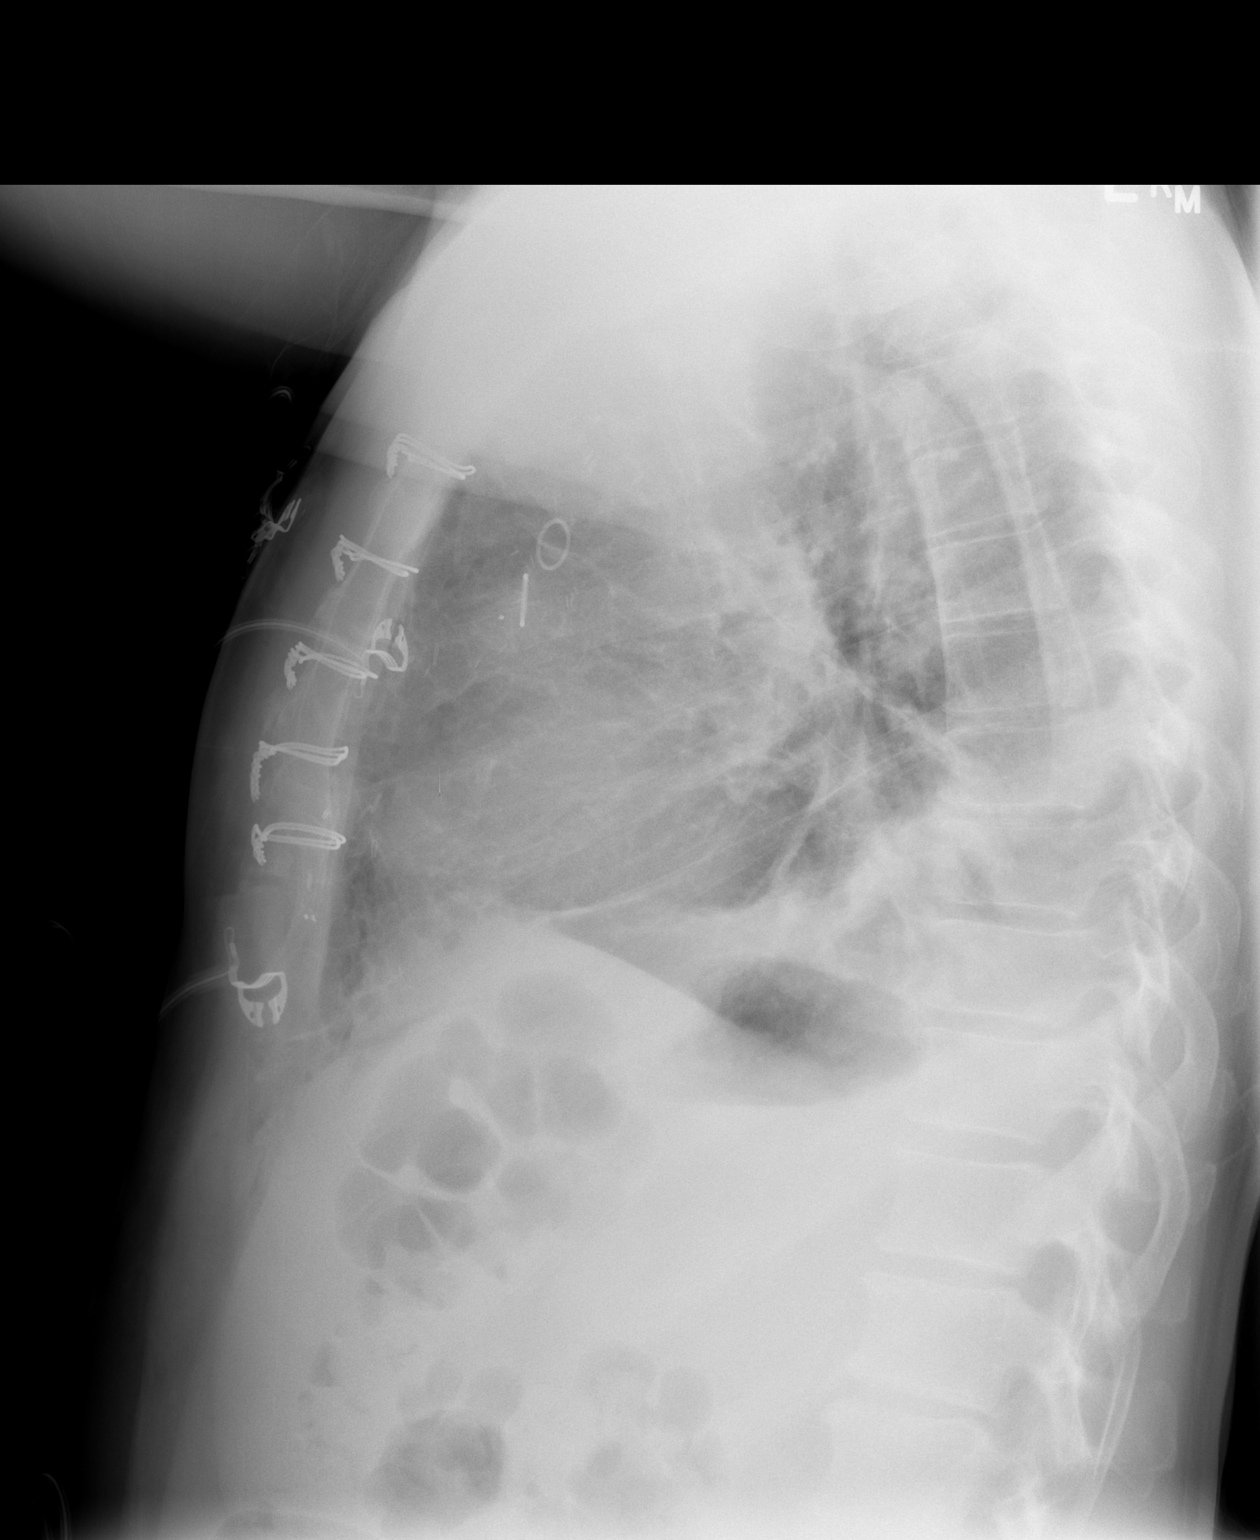

[2 of 2 positions shown; findings below may reference images not displayed]

FINDINGS: Improved aeration of the lung bases.  There remains mild
left lower lobe atelectasis and a small left effusion.  There is no
edema or pneumothorax.
IMPRESSION: Improved aeration of the lung bases.

Increase in size in small left pleural effusion.

## 2012-02-10 IMAGING — CR DG CHEST 2V
2 series · 2 of 2 positions shown · non-contrast
Comparison: 10/20/2009 and baseline postoperative exam 10/04/2009.

CLINICAL DATA: CABG.  Shortness of breath on exertion.

CHEST - 2 VIEW

[view not recorded (1 of 2)]
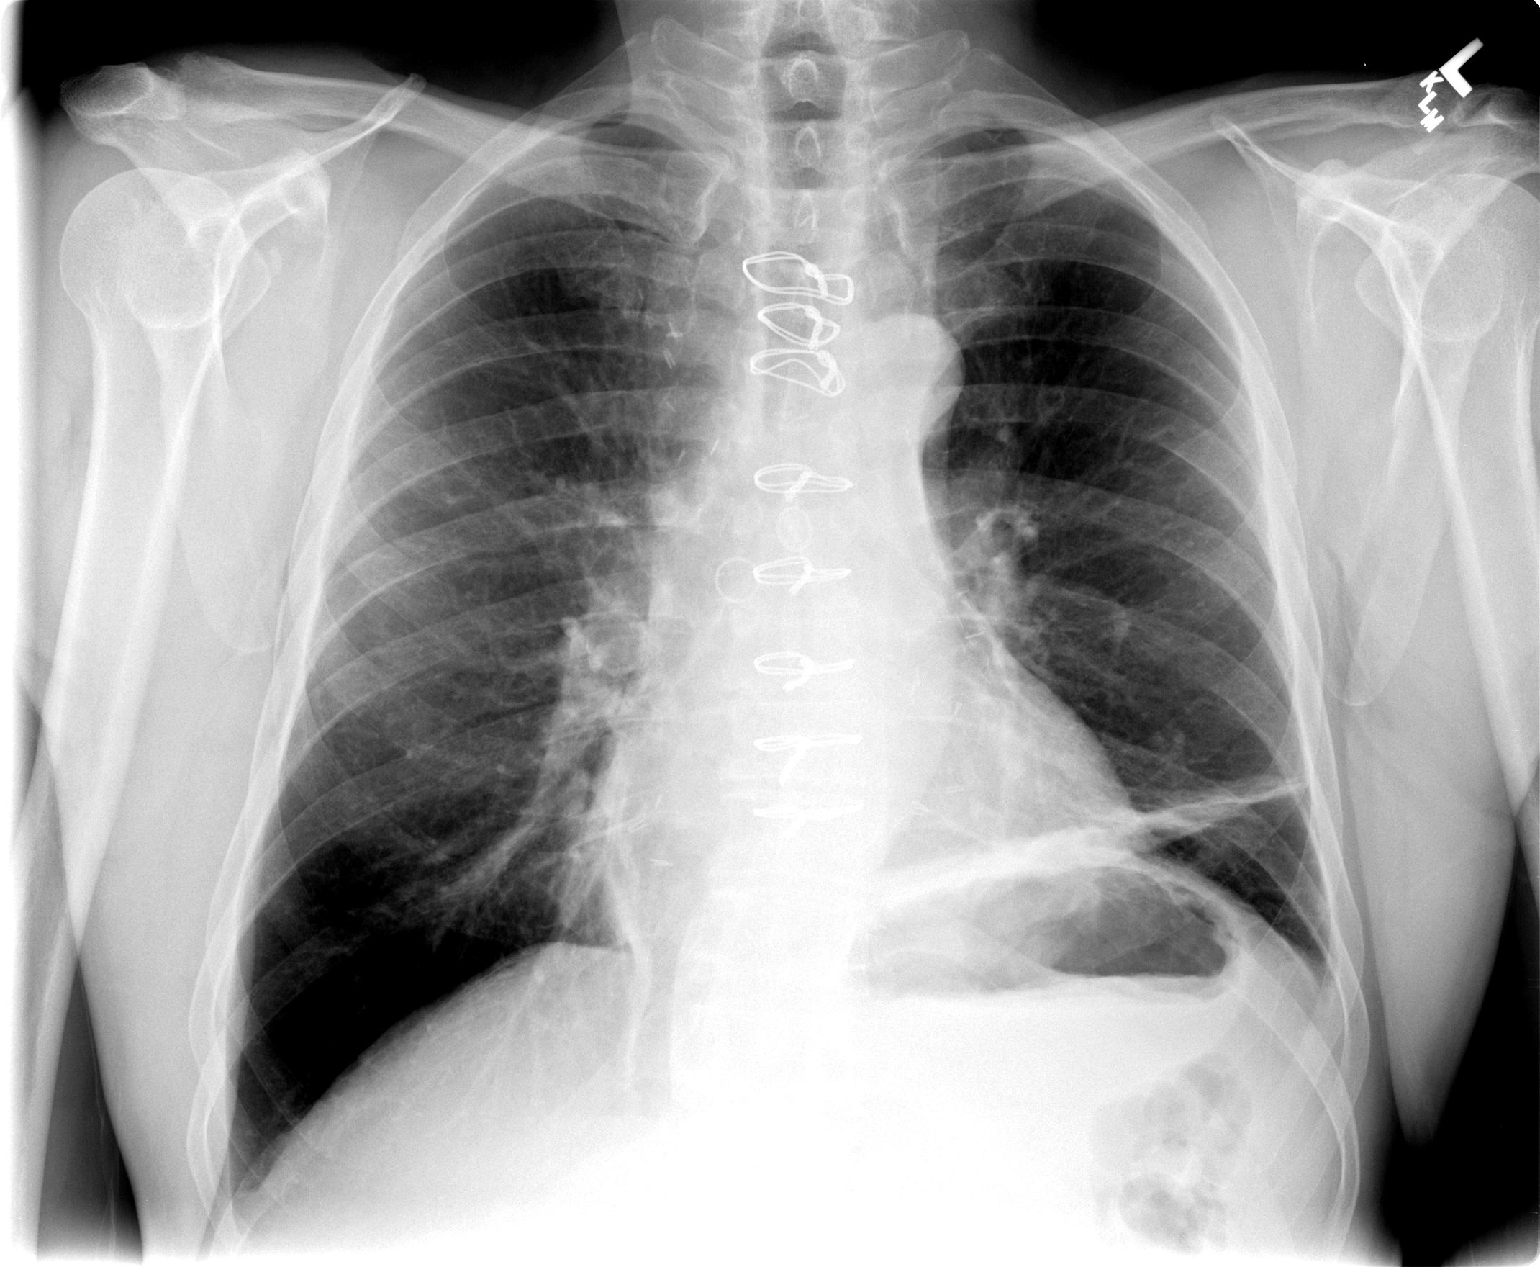

[view not recorded (2 of 2)]
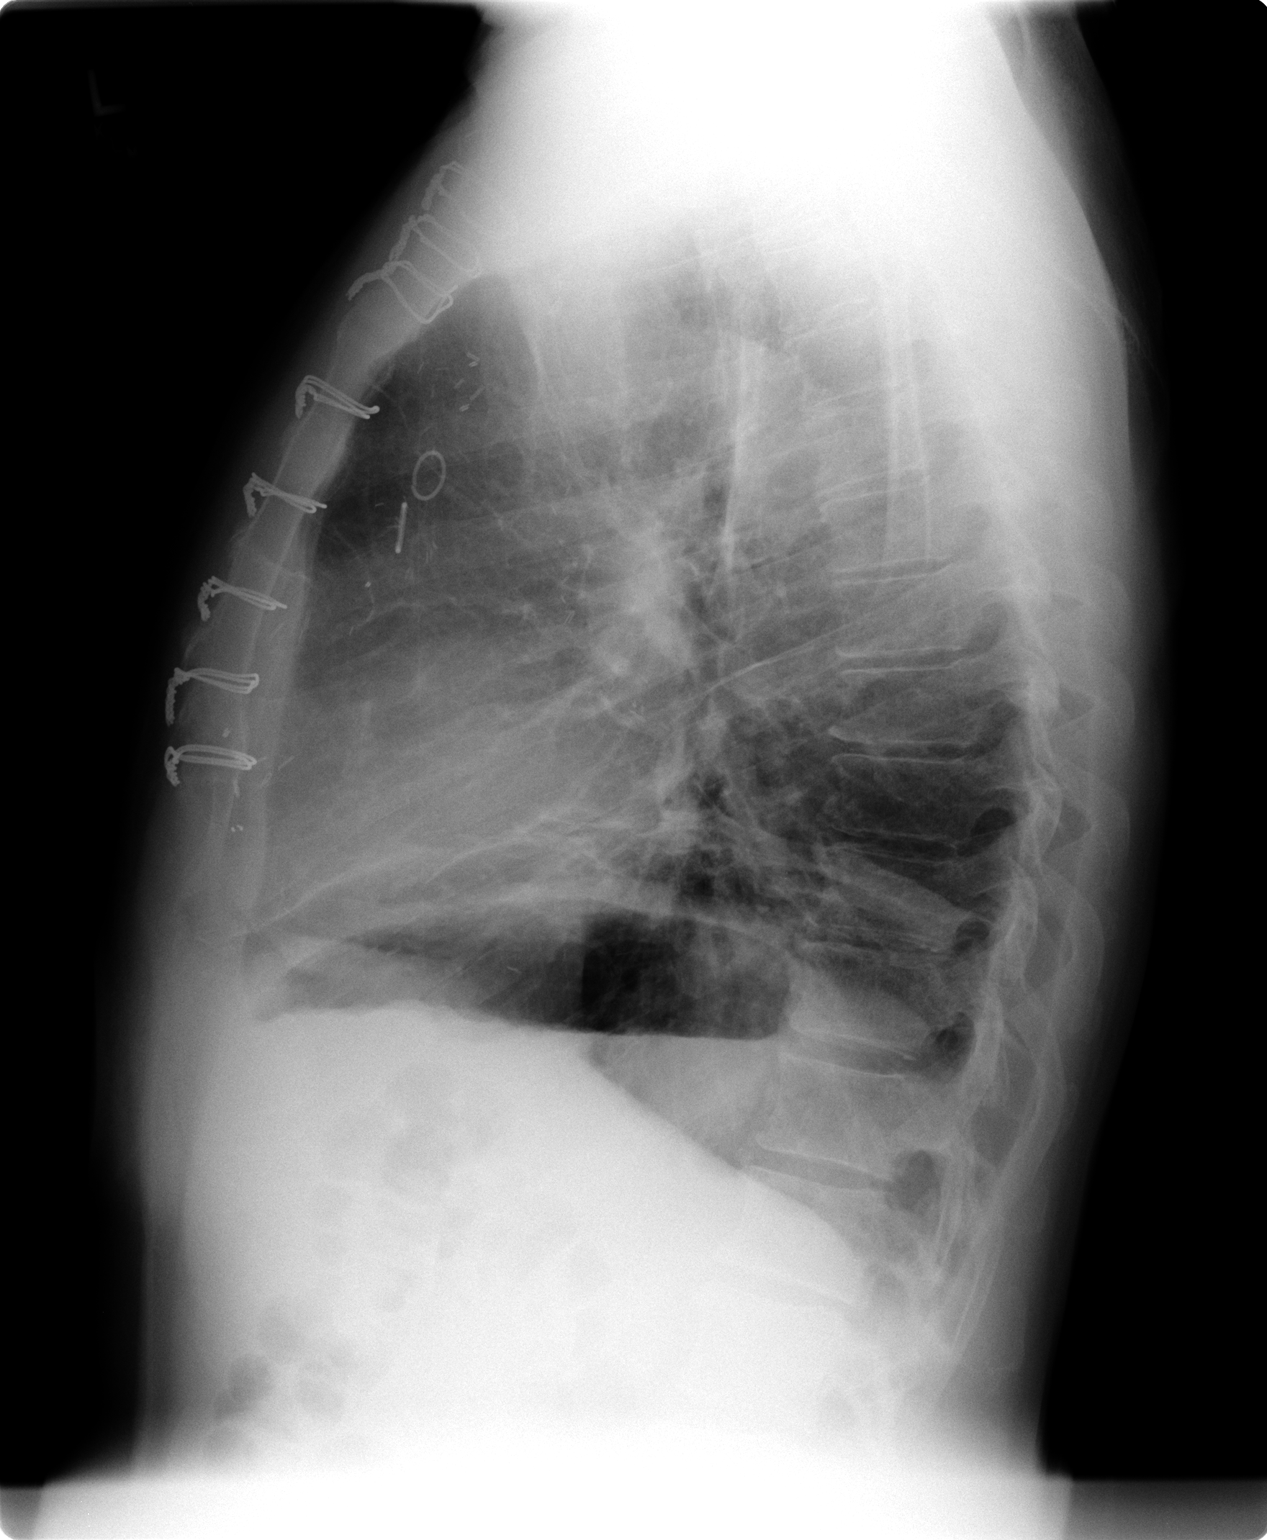

[2 of 2 positions shown; findings below may reference images not displayed]

FINDINGS: Trachea is midline.  Heart size normal. Sternotomy wires
are unchanged in position from baseline postoperative exam of
10/04/2009.  Plate-like atelectasis is seen at the left lung base,
with elevation of the left hemidiaphragm.  Minimal subsegmental
atelectasis at the right lung base, medially.  Small bilateral
pleural effusions, decreased from prior.  No edema.  No
pneumothorax.
IMPRESSION: Bibasilar atelectasis with improving bilateral pleural effusions.

## 2012-02-29 ENCOUNTER — Encounter (HOSPITAL_COMMUNITY): Payer: Self-pay | Admitting: Emergency Medicine

## 2012-02-29 ENCOUNTER — Emergency Department (HOSPITAL_COMMUNITY)
Admission: EM | Admit: 2012-02-29 | Discharge: 2012-02-29 | Disposition: A | Discharge status: Home / Self Care | Payer: BC Managed Care – PPO | Attending: Emergency Medicine | Admitting: Emergency Medicine

## 2012-02-29 DIAGNOSIS — R42 Dizziness and giddiness: Secondary | ICD-10-CM | POA: Insufficient documentation

## 2012-02-29 DIAGNOSIS — Z951 Presence of aortocoronary bypass graft: Secondary | ICD-10-CM | POA: Insufficient documentation

## 2012-02-29 DIAGNOSIS — I10 Essential (primary) hypertension: Secondary | ICD-10-CM | POA: Insufficient documentation

## 2012-02-29 DIAGNOSIS — G253 Myoclonus: Secondary | ICD-10-CM | POA: Insufficient documentation

## 2012-02-29 DIAGNOSIS — R531 Weakness: Secondary | ICD-10-CM

## 2012-02-29 DIAGNOSIS — I2510 Atherosclerotic heart disease of native coronary artery without angina pectoris: Secondary | ICD-10-CM | POA: Insufficient documentation

## 2012-02-29 DIAGNOSIS — Z7982 Long term (current) use of aspirin: Secondary | ICD-10-CM | POA: Insufficient documentation

## 2012-02-29 DIAGNOSIS — Z79899 Other long term (current) drug therapy: Secondary | ICD-10-CM | POA: Insufficient documentation

## 2012-02-29 DIAGNOSIS — E7800 Pure hypercholesterolemia, unspecified: Secondary | ICD-10-CM | POA: Insufficient documentation

## 2012-02-29 HISTORY — DX: Essential (primary) hypertension: I10

## 2012-02-29 HISTORY — DX: Pure hypercholesterolemia, unspecified: E78.00

## 2012-02-29 HISTORY — DX: Atherosclerotic heart disease of native coronary artery without angina pectoris: I25.10

## 2012-02-29 LAB — CBC WITH DIFFERENTIAL/PLATELET
Basophils Absolute: 0 10*3/uL (ref 0.0–0.1)
Basophils Relative: 0 % (ref 0–1)
Eosinophils Absolute: 0 10*3/uL (ref 0.0–0.7)
Eosinophils Relative: 1 % (ref 0–5)
HCT: 41.3 % (ref 39.0–52.0)
Hemoglobin: 14.7 g/dL (ref 13.0–17.0)
Lymphocytes Relative: 16 % (ref 12–46)
Lymphs Abs: 0.7 10*3/uL (ref 0.7–4.0)
MCH: 32.7 pg (ref 26.0–34.0)
MCHC: 35.6 g/dL (ref 30.0–36.0)
MCV: 91.8 fL (ref 78.0–100.0)
Monocytes Absolute: 0.3 10*3/uL (ref 0.1–1.0)
Monocytes Relative: 8 % (ref 3–12)
Neutro Abs: 3.2 10*3/uL (ref 1.7–7.7)
Neutrophils Relative %: 75 % (ref 43–77)
Platelets: 115 10*3/uL — ABNORMAL LOW (ref 150–400)
RBC: 4.5 MIL/uL (ref 4.22–5.81)
RDW: 12.2 % (ref 11.5–15.5)
WBC: 4.3 10*3/uL (ref 4.0–10.5)

## 2012-02-29 LAB — COMPREHENSIVE METABOLIC PANEL
ALT: 21 U/L (ref 0–53)
AST: 26 U/L (ref 0–37)
Albumin: 3.8 g/dL (ref 3.5–5.2)
Alkaline Phosphatase: 65 U/L (ref 39–117)
BUN: 16 mg/dL (ref 6–23)
CO2: 26 mEq/L (ref 19–32)
Calcium: 9.5 mg/dL (ref 8.4–10.5)
Chloride: 102 mEq/L (ref 96–112)
Creatinine, Ser: 1.06 mg/dL (ref 0.50–1.35)
GFR calc Af Amer: 87 mL/min — ABNORMAL LOW (ref >90)
GFR calc non Af Amer: 75 mL/min — ABNORMAL LOW (ref >90)
Glucose, Bld: 114 mg/dL — ABNORMAL HIGH (ref 70–99)
Potassium: 4.2 mEq/L (ref 3.5–5.1)
Sodium: 139 mEq/L (ref 135–145)
Total Bilirubin: 0.6 mg/dL (ref 0.3–1.2)
Total Protein: 6.9 g/dL (ref 6.0–8.3)

## 2012-02-29 MED ORDER — SODIUM CHLORIDE 0.9 % IV BOLUS (SEPSIS): 250.0000 mL | Freq: Once | INTRAVENOUS | Status: DC

## 2012-02-29 NOTE — ED Notes (Signed)
Fluid bolus held per MD.

## 2012-02-29 NOTE — ED Notes (Signed)
Old and New EKG given to Dr Sonyika 

## 2012-02-29 NOTE — ED Notes (Signed)
PT. WOKE UP THIS MORNING WITH DIZZINESS / GENERALIZED BODY TWITCHINGS / SLIGHT CHEST " DISCOMFORT" , DENIES CHEST PAIN , STATES HISTORY OF CABG.

## 2012-02-29 NOTE — ED Provider Notes (Signed)
History     CSN: [id number]  Arrival date & time 02/29/12  4098   First MD Initiated Contact with Patient 02/29/12 [phone]      Chief Complaint  Patient presents with  . Dizziness    (Consider location/radiation/quality/duration/timing/severity/associated sxs/prior treatment) Patient is a 61 y.o. male presenting with weakness.  Weakness The primary symptoms include dizziness. The symptoms began 2 to 6 hours ago. The symptoms are improving. The neurological symptoms are diffuse. The symptoms occurred while sleeping.  He describes the dizziness as lightheadedness. The dizziness began today. The dizziness has been resolved since its onset. It is a new problem. Dizziness also occurs with weakness.  Additional symptoms include weakness. Associated symptoms comments: myoclonus.    Past Medical History  Diagnosis Date  . Coronary artery disease   . Hypertension   . Hypercholesterolemia     Past Surgical History  Procedure Date  . Coronary artery bypass graft     No family history on file.  History  Substance Use Topics  . Smoking status: Never Smoker   . Smokeless tobacco: Not on file  . Alcohol Use: Yes      Review of Systems  Neurological: Positive for dizziness and weakness.  All other systems reviewed and are negative.    Allergies  Review of patient's allergies indicates no known allergies.  Home Medications   Current Outpatient Rx  Name Route Sig Dispense Refill  . ACETAMINOPHEN 500 MG PO TABS Oral Take 500 mg by mouth every 6 (six) hours as needed. For fever    . ASPIRIN 81 MG PO CHEW Oral Chew 81 mg by mouth daily.    Marland Kitchen EVENING PRIMROSE OIL PO Oral Take 1 tablet by mouth daily.    Marland Kitchen FOLIC ACID 1 MG PO TABS Oral Take 1 mg by mouth daily.    Marland Kitchen METOPROLOL TARTRATE 25 MG PO TABS Oral Take 25 mg by mouth 2 (two) times daily.    . ADULT MULTIVITAMIN W/MINERALS CH Oral Take 1 tablet by mouth daily.    Marland Kitchen NIACIN ER (ANTIHYPERLIPIDEMIC) 500 MG PO TBCR Oral Take  1,000 mg by mouth at bedtime.    . OMEGA-3-ACID ETHYL ESTERS 1 G PO CAPS Oral Take 1 g by mouth 3 (three) times daily.    Marland Kitchen RAMIPRIL 5 MG PO TABS Oral Take 5 mg by mouth daily.    Marland Kitchen ROSUVASTATIN CALCIUM 40 MG PO TABS Oral Take 40 mg by mouth daily.    . SAW PALMETTO (SERENOA REPENS) 80 MG PO CAPS Oral Take 80 mg by mouth 2 (two) times daily.      BP 127/70  Pulse 91  Temp 98 F (36.7 C) (Oral)  Resp 20  SpO2 95%  Physical Exam  Constitutional: He is oriented to person, place, and time. He appears well-developed and well-nourished.  HENT:  Head: Normocephalic and atraumatic.  Eyes: Conjunctivae normal are normal. Pupils are equal, round, and reactive to light.  Neck: Normal range of motion. Neck supple.  Cardiovascular: Normal rate, regular rhythm, normal heart sounds and intact distal pulses.   Pulmonary/Chest: Effort normal and breath sounds normal.       Well healed sternotomy scar to chest  Abdominal: Soft. Bowel sounds are normal.  Neurological: He is alert and oriented to person, place, and time.  Skin: Skin is warm and dry.  Psychiatric: He has a normal mood and affect. His behavior is normal. Judgment and thought content normal.    ED Course  Procedures (including  critical care time)  Labs Reviewed  CBC WITH DIFFERENTIAL - Abnormal; Notable for the following:    Platelets 115 (*)  PLATELET COUNT CONFIRMED BY SMEAR   All other components within normal limits  COMPREHENSIVE METABOLIC PANEL - Abnormal; Notable for the following:    Glucose, Bld 114 (*)     GFR calc non Af Amer 75 (*)     GFR calc Af Amer 87 (*)     All other components within normal limits   No results found.   No diagnosis found.  Date: 02/29/2012  Rate: 82  Rhythm: normal sinus rhythm  QRS Axis: normal  Intervals: normal  ST/T Wave abnormalities: nonspecific ST changes  Conduction Disutrbances:none  Narrative Interpretation:   Old EKG Reviewed: unchanged   MDM  + nonspecific myoclonus  and vertigo,  Resolved. Improved.  Will dc to fu        Chionesu Lytle Michaels, MD 02/29/12 [phone]

## 2012-02-29 NOTE — ED Notes (Signed)
PT ambulated with baseline gait; VSS; A&Ox3; no signs of distress; respirations even and unlabored; skin warm and dry; no questions upon discharge.  

## 2012-03-01 LAB — POCT I-STAT TROPONIN I: Troponin i, poc: 0 ng/mL (ref 0.00–0.08)

## 2012-11-11 ENCOUNTER — Other Ambulatory Visit: Payer: Self-pay | Admitting: Cardiovascular Disease

## 2013-02-10 ENCOUNTER — Other Ambulatory Visit: Payer: Self-pay | Admitting: Cardiovascular Disease

## 2013-02-10 ENCOUNTER — Other Ambulatory Visit: Payer: Self-pay | Admitting: Internal Medicine

## 2013-02-13 NOTE — Telephone Encounter (Signed)
Rx was sent to pharmacy electronically. 

## 2013-02-21 ENCOUNTER — Encounter: Payer: Self-pay | Admitting: *Deleted

## 2013-03-02 ENCOUNTER — Encounter: Payer: Self-pay | Admitting: Internal Medicine

## 2013-03-05 ENCOUNTER — Encounter: Payer: Self-pay | Admitting: Internal Medicine

## 2013-03-05 ENCOUNTER — Ambulatory Visit (INDEPENDENT_AMBULATORY_CARE_PROVIDER_SITE_OTHER): Payer: BC Managed Care – PPO | Admitting: Internal Medicine

## 2013-03-05 VITALS — BP 150/64 | HR 56 | Ht 68.0 in | Wt 195.1 lb

## 2013-03-05 DIAGNOSIS — I2510 Atherosclerotic heart disease of native coronary artery without angina pectoris: Secondary | ICD-10-CM

## 2013-03-05 DIAGNOSIS — Z951 Presence of aortocoronary bypass graft: Secondary | ICD-10-CM

## 2013-03-05 DIAGNOSIS — E785 Hyperlipidemia, unspecified: Secondary | ICD-10-CM | POA: Insufficient documentation

## 2013-03-05 DIAGNOSIS — I10 Essential (primary) hypertension: Secondary | ICD-10-CM

## 2013-03-05 MED ORDER — ROSUVASTATIN CALCIUM 40 MG PO TABS: ORAL_TABLET | ORAL | Status: DC

## 2013-03-05 NOTE — Patient Instructions (Addendum)
Your physician recommends that you return for lab work in 1-2 weeks. You will need to be fasting.   Your physician wants you to follow-up in: 1 year. You will receive a reminder letter in the mail two months in advance. If you don't receive a letter, please call our office to schedule the follow-up appointment.

## 2013-03-05 NOTE — Progress Notes (Signed)
OFFICE NOTE  Chief Complaint:  No complaints, annual visit  Primary Care Physician: Delorse Lek, MD  HPI:  Patrick Stout is a pleasant 62 year old gentleman status post bypass surgery with LIMA to LAD, RIMA to the OM and SVG to D1, SVG to PDA, and sequential SVG to PDA in April of 2011. Overall, he is doing very well, continues to work and exercise without any significant issues. He has a Systems analyst and exercises with him at least once or twice a week. Is also active in hiking and competes in some hiking activities with his dog. 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 Based on that, we are aggressively treating his cholesterol and is currently on Crestor 40 mg daily. He is also on a low-dose ACE inhibitor, beta-blocker, aspirin and other over-the-counter medications.   PMHx:  Past Medical History  Diagnosis Date  . 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 Hypertension   . Hypercholesterolemia   . Family history of heart disease   . History of nuclear stress test 10/01/2009    bruce protocol; ekg changes positive for ischemia - L circumflex (subsequent cath & CABG)    Past Surgical History  Procedure Laterality Date  . Coronary artery bypass graft  10/04/2009    LIMA to LAD, RIMA to OM, SVG to D1, SVG to PDA, sequential SVG to PDA (Dr. Cornelius Moras)  . Knee surgery  2008  . Transthoracic echocardiogram  10/04/2009    EF 55-60%    FAMHx:  Family History  Problem Relation Age of Onset  . Heart attack Father   . Coronary artery disease Father   . Esophageal cancer Sister   . Hypertension Sister   . COPD Sister     SOCHx:   reports that he has never smoked. He has never used smokeless tobacco. He reports that  drinks alcohol. He reports  that he does not use illicit drugs.  ALLERGIES:  No Known Allergies  ROS: A comprehensive review of systems was negative.  HOME MEDS: Current Outpatient Prescriptions  Medication Sig Dispense Refill  . acetaminophen (TYLENOL) 500 MG tablet Take 500 mg by mouth every 6 (six) hours as needed. For fever      . aspirin 81 MG chewable tablet Chew 162 mg by mouth daily.       Marland Kitchen EVENING PRIMROSE OIL PO Take 1 tablet by mouth daily.      . Flaxseed, Linseed, (FLAXSEED OIL) 1000 MG CAPS Take by mouth daily.      . folic acid (FOLVITE) 1 MG tablet TAKE 1 TABLET BY MOUTH EVERY DAY  30 tablet  0  . metoprolol tartrate (LOPRESSOR) 25 MG tablet Take 25 mg by mouth 3 (three) times daily.       . Multiple Vitamin (MULTIVITAMIN WITH MINERALS) TABS Take 1 tablet by mouth daily.      . naproxen sodium (ANAPROX) 220 MG tablet Take 220 mg by mouth as needed.      . niacin (NIASPAN) 500 MG CR tablet Take 1,000 mg by mouth at bedtime.      Marland Kitchen omega-3 acid ethyl esters (LOVAZA) 1 G capsule Take 1 g by mouth 3 (three) times daily.      . ramipril (ALTACE) 5 MG tablet Take  5 mg by mouth daily.      . rosuvastatin (CRESTOR) 40 MG tablet Take 1 tablet by mouth daily.  30 tablet  11   No current facility-administered medications for this visit.    LABS/IMAGING: No results found for this or any previous visit (from the past 48 hour(s)). No results found.  VITALS: BP 150/64  Pulse 56  Ht 5\' 8"  (1.727 m)  Wt 195 lb 1.6 oz (88.497 kg)  BMI 29.67 kg/m2  EXAM: General appearance: alert and no distress Neck: no adenopathy, no carotid bruit, no JVD, supple, symmetrical, trachea midline and thyroid not enlarged, symmetric, no tenderness/mass/nodules Lungs: clear to auscultation bilaterally Heart: regular rate and rhythm, S1, S2 normal, no murmur, click, rub or gallop Abdomen: soft, non-tender; bowel sounds normal; no masses,  no organomegaly Extremities: extremities normal, atraumatic, no cyanosis or  edema Pulses: 2+ and symmetric Skin: Skin color, texture, turgor normal. No rashes or lesions Neurologic: Grossly normal  EKG: Sinus bradycardia 56  ASSESSMENT: 1. Coronary artery disease status post 5 vessel CABG in 2011 - asymptomatic 2. Strong kinetics for coronary disease 3. Dyslipidemia - recently controlled per PCP's labs 4. Hypertension - home measures are at goal  PLAN: 1.   Patrick Stout is doing very well continues to take excellent care of himself. He is active and eats very healthy. He's had no further chest pain or shortness of breath. He is due for repeat lipid profile at like to obtain an NMR to make sure that he is adequately treated. We will go ahead and refill his Crestor today and make changes as necessary. Currently his medical regimen his optimal. Plan to see him back in one year or sooner as necessary.  Chrystie Nose, MD, [facility] Attending Cardiologist [facility]  HILTY,Kenneth C 03/05/2013, 9:24 AM

## 2013-03-18 ENCOUNTER — Other Ambulatory Visit: Payer: Self-pay | Admitting: Internal Medicine

## 2013-03-19 NOTE — Telephone Encounter (Signed)
Rx was sent to pharmacy electronically. 

## 2013-11-28 ENCOUNTER — Other Ambulatory Visit: Payer: Self-pay

## 2013-11-28 MED ORDER — RAMIPRIL 5 MG PO CAPS: 5.0000 mg | ORAL_CAPSULE | Freq: Every day | ORAL | Status: DC

## 2013-11-28 NOTE — Telephone Encounter (Signed)
Rx was sent to pharmacy electronically. 

## 2014-02-14 ENCOUNTER — Other Ambulatory Visit: Payer: Self-pay | Admitting: Cardiovascular Disease

## 2014-02-15 NOTE — Telephone Encounter (Signed)
Rx refill sent to patient pharmacy   

## 2014-02-28 ENCOUNTER — Encounter: Payer: Self-pay | Admitting: Internal Medicine

## 2014-02-28 ENCOUNTER — Ambulatory Visit (INDEPENDENT_AMBULATORY_CARE_PROVIDER_SITE_OTHER): Payer: BC Managed Care – PPO | Admitting: Internal Medicine

## 2014-02-28 ENCOUNTER — Other Ambulatory Visit: Payer: Self-pay | Admitting: Internal Medicine

## 2014-02-28 VITALS — BP 130/80 | HR 57 | Ht 68.0 in | Wt 179.3 lb

## 2014-02-28 DIAGNOSIS — E785 Hyperlipidemia, unspecified: Secondary | ICD-10-CM

## 2014-02-28 DIAGNOSIS — Z951 Presence of aortocoronary bypass graft: Secondary | ICD-10-CM

## 2014-02-28 DIAGNOSIS — I10 Essential (primary) hypertension: Secondary | ICD-10-CM

## 2014-02-28 NOTE — Patient Instructions (Signed)
Your physician wants you to follow-up in: 1 year with Dr. Hilty. You will receive a reminder letter in the mail two months in advance. If you don't receive a letter, please call our office to schedule the follow-up appointment.  

## 2014-02-28 NOTE — Progress Notes (Signed)
OFFICE NOTE  Chief Complaint:  No complaints, annual visit  Primary Care Physician: Stephens Shire, MD  HPI:  Patrick Stout is a pleasant 63 year old gentleman status post bypass surgery with LIMA to LAD, RIMA to the OM and SVG to D1, SVG to PDA, and sequential SVG to PDA in April of 2011. Overall, he is doing very well, continues to work and exercise without any significant issues. He has a Physiological scientist and exercises with him at least once or twice a week. Is also active in hiking and competes in some hiking activities with his dog. 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 Based on that, we are aggressively treating his cholesterol and is currently on Crestor 40 mg daily. He is also on a low-dose ACE inhibitor, beta-blocker, aspirin and other over-the-counter medications.   Patrick Stout returns today in followup. He is doing extremely well. He is significantly increased his exercise. He appears to be in very good shape. He has lost another 20 pounds as he was borderline diabetic. His blood sugars have responded appropriately. Blood pressure is at goal. He reportedly had cholesterol testing through his primary care provider and we will go head and obtain those labs today. I did talk about possibly testing for particle numbers which would be helpful given his genetic history to make sure that we have optimally controlled his cholesterol. Again he remains asymptomatic and does a significant amount of activity.  PMHx:  Past Medical History  Diagnosis Date  . 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 Hypertension   . Hypercholesterolemia   . Family history of heart disease   . History of nuclear stress test 10/01/2009    bruce protocol; ekg changes positive  for ischemia - L circumflex (subsequent cath & CABG)    Past Surgical History  Procedure Laterality Date  . Coronary artery bypass graft  10/04/2009    LIMA to LAD, RIMA to OM, SVG to D1, SVG to PDA, sequential SVG to PDA (Dr. Roxy Manns)  . Knee surgery  2008  . Transthoracic echocardiogram  10/04/2009    EF 55-60%    FAMHx:  Family History  Problem Relation Age of Onset  . Heart attack Father   . Coronary artery disease Father   . Esophageal cancer Sister   . Hypertension Sister   . COPD Sister     SOCHx:   reports that he has never smoked. He has never used smokeless tobacco. He reports that he drinks alcohol. He reports that he does not use illicit drugs.  ALLERGIES:  No Known Allergies  ROS: A comprehensive review of systems was negative.  HOME MEDS: Current Outpatient Prescriptions  Medication Sig Dispense Refill  . acetaminophen (TYLENOL) 500 MG tablet Take 500 mg by mouth every 6 (six) hours as needed. For fever      . aspirin 81 MG chewable tablet Chew 162 mg by mouth daily.       Marland Kitchen EVENING PRIMROSE OIL PO Take 1 tablet by mouth daily.      . Flaxseed, Linseed, (FLAXSEED OIL) 1000 MG CAPS Take by mouth daily.      . folic acid (FOLVITE) 1 MG tablet Take 1 tablet (1 mg total) by mouth daily.  30 tablet  11  .  metoprolol tartrate (LOPRESSOR) 25 MG tablet Take 25 mg by mouth 3 (three) times daily.       . Multiple Vitamin (MULTIVITAMIN WITH MINERALS) TABS Take 1 tablet by mouth daily.      . naproxen sodium (ANAPROX) 220 MG tablet Take 220 mg by mouth as needed.      . niacin (NIASPAN) 500 MG CR tablet Take 1,000 mg by mouth at bedtime.      Marland Kitchen omega-3 acid ethyl esters (LOVAZA) 1 G capsule Take 1 g by mouth 3 (three) times daily.      . ramipril (ALTACE) 5 MG capsule TAKE ONE CAPSULE BY MOUTH EVERY DAY  90 capsule  0  . rosuvastatin (CRESTOR) 40 MG tablet Take 1 tablet by mouth daily.  30 tablet  11  . tamsulosin (FLOMAX) 0.4 MG CAPS capsule Take 1 capsule by mouth daily.        No current facility-administered medications for this visit.    LABS/IMAGING: No results found for this or any previous visit (from the past 48 hour(s)). No results found.  VITALS: BP 130/80  Pulse 57  Ht 5\' 8"  (1.727 m)  Wt 179 lb 4.8 oz (81.33 kg)  BMI 27.27 kg/m2  EXAM: General appearance: alert and no distress Neck: no adenopathy, no carotid bruit, no JVD, supple, symmetrical, trachea midline and thyroid not enlarged, symmetric, no tenderness/mass/nodules Lungs: clear to auscultation bilaterally Heart: regular rate and rhythm, S1, S2 normal, no murmur, click, rub or gallop Abdomen: soft, non-tender; bowel sounds normal; no masses,  no organomegaly Extremities: extremities normal, atraumatic, no cyanosis or edema Pulses: 2+ and symmetric Skin: Skin color, texture, turgor normal. No rashes or lesions Neurologic: Grossly normal  EKG: Sinus bradycardia 57  ASSESSMENT: 1. Coronary artery disease status post 5 vessel CABG in 2011 - asymptomatic 2. Strong genetics for coronary disease 3. Dyslipidemia - recently controlled per PCP's labs 4. Hypertension - at goal  PLAN: 1.   Patrick Stout is doing very well continues to take excellent care of himself. He is active and eats very healthy. He has lost a further 20 pounds and appears to be in very good shape. He has good stamina and denies any shortness of breath or chest pain. His blood pressure is well-controlled. We will obtain labs from his primary care provider to look further at his cholesterol and he may need additional particle testing to ensure that he is reached his target is to minimize further coronary disease.    Plan to see him back annually or sooner if necessary.  Pixie Casino, MD, [facility] Attending Cardiologist [facility] [place] [facility] 02/28/2014, 1:51 PM

## 2014-02-28 NOTE — Telephone Encounter (Signed)
Rx was sent to pharmacy electronically. 

## 2014-03-01 ENCOUNTER — Ambulatory Visit: Payer: BC Managed Care – PPO | Admitting: Internal Medicine

## 2014-05-25 ENCOUNTER — Other Ambulatory Visit: Payer: Self-pay | Admitting: Internal Medicine

## 2014-05-27 NOTE — Telephone Encounter (Signed)
Rx(s) sent to pharmacy electronically.  

## 2015-02-14 ENCOUNTER — Other Ambulatory Visit: Payer: Self-pay | Admitting: Internal Medicine

## 2015-02-14 NOTE — Telephone Encounter (Signed)
Rx(s) sent to pharmacy electronically.  

## 2015-02-18 ENCOUNTER — Other Ambulatory Visit: Payer: Self-pay | Admitting: Internal Medicine

## 2015-02-18 NOTE — Telephone Encounter (Signed)
E-SENT PHARMACY  

## 2015-03-03 ENCOUNTER — Encounter: Payer: Self-pay | Admitting: Internal Medicine

## 2015-03-03 ENCOUNTER — Ambulatory Visit (INDEPENDENT_AMBULATORY_CARE_PROVIDER_SITE_OTHER): Payer: BLUE CROSS/BLUE SHIELD | Admitting: Internal Medicine

## 2015-03-03 VITALS — BP 120/80 | HR 57 | Ht 69.0 in | Wt 185.7 lb

## 2015-03-03 DIAGNOSIS — I10 Essential (primary) hypertension: Secondary | ICD-10-CM

## 2015-03-03 DIAGNOSIS — E785 Hyperlipidemia, unspecified: Secondary | ICD-10-CM | POA: Diagnosis not present

## 2015-03-03 DIAGNOSIS — Z951 Presence of aortocoronary bypass graft: Secondary | ICD-10-CM

## 2015-03-03 NOTE — Patient Instructions (Signed)
No change with current medications  Your physician has requested that you have en exercise stress myoview for Sept 2017 before next appointment. For further information please visit [email or web address]. Please follow instruction sheet, as given.  Your physician wants you to follow-up in 12 months with Dr Debara Pickett- AFTER MYOVIEW. You will receive a reminder letter in the mail two months in advance. If you don't receive a letter, please call our office to schedule the follow-up appointment.

## 2015-03-03 NOTE — Progress Notes (Signed)
OFFICE NOTE  Chief Complaint:  No complaints, annual visit  Primary Care Physician: Stephens Shire, MD  HPI:  Patrick Stout is a pleasant 64 year old gentleman status post bypass surgery with LIMA to LAD, RIMA to the OM and SVG to D1, SVG to PDA, and sequential SVG to PDA in April of 2011. Overall, he is doing very well, continues to work and exercise without any significant issues. He has a Physiological scientist and exercises with him at least once or twice a week. Is also active in hiking and competes in some hiking activities with his dog. 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 Based on that, we are aggressively treating his cholesterol and is currently on Crestor 40 mg daily. He is also on a low-dose ACE inhibitor, beta-blocker, aspirin and other over-the-counter medications.   [name] returns today in followup. He is doing extremely well. He is significantly increased his exercise. He appears to be in very good shape. He has lost another 20 pounds as he was borderline diabetic. His blood sugars have responded appropriately. Blood pressure is at goal. He reportedly had cholesterol testing through his primary care provider and we will go head and obtain those labs today. I did talk about possibly testing for particle numbers which would be helpful given his genetic history to make sure that we have optimally controlled his cholesterol. Again he remains asymptomatic and does a significant amount of activity.  [name] returns today for follow-up. He seems to be continued do well from a physical standpoint. Unfortunately he's under significant stress. His wife who has early Parkinson's recently became septic from a kidney stone and is having problems with short-term memory loss. He also recently lost his mother. He continues to be  physically active and exercises very regularly. He appears to be in good physical shape. His EKG shows no ischemic changes. He recently had cholesterol testing from his primary care provider which demonstrated a total cholesterol of 136, triglycerides 47, HDL 55 and LDL 72.  PMHx:  Past Medical History  Diagnosis Date  . 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 Hypertension   . Hypercholesterolemia   . Family history of heart disease   . History of nuclear stress test 10/01/2009    bruce protocol; ekg changes positive for ischemia - L circumflex (subsequent cath & CABG)    Past Surgical History  Procedure Laterality Date  . Coronary artery bypass graft  10/04/2009    LIMA to LAD, RIMA to OM, SVG to D1, SVG to PDA, sequential SVG to PDA (Dr. Roxy Manns)  . Knee surgery  2008  . Transthoracic echocardiogram  10/04/2009    EF 55-60%    FAMHx:  Family History  Problem Relation Age of Onset  . Heart attack Father   . Coronary artery disease Father   . Esophageal cancer Sister   . Hypertension Sister   . COPD Sister     SOCHx:   reports that he has never smoked. He has never used smokeless tobacco. He reports that he drinks alcohol. He reports that he does not use illicit drugs.  ALLERGIES:  No Known Allergies  ROS: A comprehensive review of systems was negative.  HOME MEDS: Current Outpatient Prescriptions  Medication Sig Dispense Refill  .  acetaminophen (TYLENOL) 500 MG tablet Take 500 mg by mouth every 6 (six) hours as needed. For fever    . aspirin 81 MG chewable tablet Chew 162 mg by mouth daily.     . CRESTOR 40 MG tablet TAKE 1 TABLET BY MOUTH DAILY. 30 tablet 11  . EVENING PRIMROSE OIL PO Take 1 tablet by mouth daily.    . Flaxseed, Linseed, (FLAXSEED OIL) 1000 MG CAPS Take by mouth daily.    . folic acid (FOLVITE) 1 MG tablet TAKE 1 TABLET BY MOUTH EVERY DAY 30 tablet 0  . metoprolol tartrate  (LOPRESSOR) 25 MG tablet Take 25 mg by mouth 3 (three) times daily.     . Multiple Vitamin (MULTIVITAMIN WITH MINERALS) TABS Take 1 tablet by mouth daily.    . naproxen sodium (ANAPROX) 220 MG tablet Take 220 mg by mouth as needed.    . ramipril (ALTACE) 5 MG capsule TAKE ONE CAPSULE BY MOUTH EVERY DAY 90 capsule 2  . tamsulosin (FLOMAX) 0.4 MG CAPS capsule Take 1 capsule by mouth daily.     No current facility-administered medications for this visit.    LABS/IMAGING: No results found for this or any previous visit (from the past 48 hour(s)). No results found.  VITALS: BP 120/80 mmHg  Pulse 57  Ht 5\' 9"  (1.753 m)  Wt 185 lb 11.2 oz (84.233 kg)  BMI 27.41 kg/m2  EXAM: General appearance: alert and no distress Neck: no adenopathy, no carotid bruit, no JVD, supple, symmetrical, trachea midline and thyroid not enlarged, symmetric, no tenderness/mass/nodules Lungs: clear to auscultation bilaterally Heart: regular rate and rhythm, S1, S2 normal, no murmur, click, rub or gallop Abdomen: soft, non-tender; bowel sounds normal; no masses,  no organomegaly Extremities: extremities normal, atraumatic, no cyanosis or edema Pulses: 2+ and symmetric Skin: Skin color, texture, turgor normal. No rashes or lesions Neurologic: Grossly normal  EKG: Sinus bradycardia 57  ASSESSMENT: 1. Coronary artery disease status post 5 vessel CABG in 2011 - asymptomatic 2. Strong genetics for coronary disease 3. Dyslipidemia - recently controlled per PCP's labs, may need a dose reduction in Crestor 4. Hypertension - at goal  PLAN: 1.   Mr. Wuthrich is doing very well continues to take excellent care of himself. He is active and eats very healthy. He appears to be in very good shape. He has good stamina and denies any shortness of breath or chest pain. His blood pressure is well-controlled. Lab work demonstrates his cholesterol is at goal. As it is been almost 5 years since his bypass surgery, we may wish to  consider stress testing next year. I'll go ahead and order an exercise Myoview for a few weeks prior to his return office visit.  Plan to see him back annually or sooner if necessary.  Pixie Casino, MD, [facility] Attending Cardiologist [place] [facility] Hilty 03/03/2015, 8:38 AM

## 2015-03-04 ENCOUNTER — Encounter: Payer: Self-pay | Admitting: Internal Medicine

## 2015-03-16 ENCOUNTER — Other Ambulatory Visit: Payer: Self-pay | Admitting: Internal Medicine

## 2015-03-17 NOTE — Telephone Encounter (Signed)
REFILL 

## 2015-04-17 ENCOUNTER — Telehealth: Payer: Self-pay | Admitting: Internal Medicine

## 2015-04-17 DIAGNOSIS — Z951 Presence of aortocoronary bypass graft: Secondary | ICD-10-CM

## 2015-04-17 NOTE — Telephone Encounter (Signed)
Returned call to patient.He stated he would like to have stress test done this year since he has met his deductible.Advised Dr.Hilty out of office.I will send message to him for advice.

## 2015-04-17 NOTE — Telephone Encounter (Signed)
Pt would like to know if he can have his stress test this [id number]). It will be less expensive for him from his insurance standpoint.Marland Kitchen

## 2015-04-18 NOTE — Telephone Encounter (Signed)
Ok to order the stress test this year - indication is 5 years post-CABG surveillance.  Dr. Lemmie Evens

## 2015-04-18 NOTE — Telephone Encounter (Signed)
Returned call to patient.No answer.Left message on personal voice mail Dr.Hilty advised ok to have stress test this year.Schedulers will call back to schedule.

## 2015-04-29 ENCOUNTER — Telehealth (HOSPITAL_COMMUNITY): Payer: Self-pay

## 2015-04-29 NOTE — Telephone Encounter (Signed)
Encounter complete. 

## 2015-05-06 ENCOUNTER — Ambulatory Visit (HOSPITAL_COMMUNITY)
Admission: RE | Admit: 2015-05-06 | Discharge: 2015-05-06 | Disposition: A | Discharge status: Home / Self Care | Payer: BLUE CROSS/BLUE SHIELD | Source: Ambulatory Visit | Attending: Cardiovascular Disease | Admitting: Cardiovascular Disease

## 2015-05-06 DIAGNOSIS — Z8249 Family history of ischemic heart disease and other diseases of the circulatory system: Secondary | ICD-10-CM | POA: Insufficient documentation

## 2015-05-06 DIAGNOSIS — Z951 Presence of aortocoronary bypass graft: Secondary | ICD-10-CM | POA: Insufficient documentation

## 2015-05-06 DIAGNOSIS — R5383 Other fatigue: Secondary | ICD-10-CM | POA: Diagnosis not present

## 2015-05-06 DIAGNOSIS — I10 Essential (primary) hypertension: Secondary | ICD-10-CM | POA: Diagnosis not present

## 2015-05-06 LAB — MYOCARDIAL PERFUSION IMAGING
Estimated workload: 11.7 METS
Exercise duration (min): 10 min
Exercise duration (sec): 0 s
LV dias vol: 113 mL
LV sys vol: 50 mL
MPHR: 158 {beats}/min
Peak HR: 151 {beats}/min
Percent HR: 96 %
Percent of predicted max HR: 95 %
RPE: 16
Rest HR: 57 {beats}/min
SDS: 0
SRS: 0
SSS: 0
Stage 1 DBP: 87 mmHg
Stage 1 Grade: 0 %
Stage 1 HR: 73 {beats}/min
Stage 1 SBP: 159 mmHg
Stage 1 Speed: 0 mph
Stage 2 Grade: 0 %
Stage 2 HR: 69 {beats}/min
Stage 2 Speed: 1 mph
Stage 3 Grade: 0 %
Stage 3 HR: 69 {beats}/min
Stage 3 Speed: 1 mph
Stage 4 DBP: 74 mmHg
Stage 4 Grade: 10 %
Stage 4 HR: 100 {beats}/min
Stage 4 SBP: 141 mmHg
Stage 4 Speed: 1.7 mph
Stage 5 DBP: 81 mmHg
Stage 5 Grade: 12 %
Stage 5 HR: 117 {beats}/min
Stage 5 SBP: 183 mmHg
Stage 5 Speed: 2.5 mph
Stage 6 DBP: 107 mmHg
Stage 6 Grade: 14 %
Stage 6 HR: 144 {beats}/min
Stage 6 SBP: 194 mmHg
Stage 6 Speed: 3.4 mph
Stage 7 Grade: 16 %
Stage 7 HR: 151 {beats}/min
Stage 7 Speed: 4.2 mph
Stage 8 DBP: 74 mmHg
Stage 8 Grade: 0 %
Stage 8 HR: 137 {beats}/min
Stage 8 SBP: 187 mmHg
Stage 8 Speed: 0 mph
Stage 9 DBP: 108 mmHg
Stage 9 Grade: 0 %
Stage 9 HR: 82 {beats}/min
Stage 9 SBP: 178 mmHg
Stage 9 Speed: 0 mph
TID: 1.09

## 2015-05-06 MED ORDER — TECHNETIUM TC 99M SESTAMIBI GENERIC - CARDIOLITE
31.9000 | Freq: Once | INTRAVENOUS | Status: AC | PRN
  Administered 2015-05-06: 31.9 via INTRAVENOUS

## 2015-05-06 MED ORDER — TECHNETIUM TC 99M SESTAMIBI GENERIC - CARDIOLITE
10.7000 | Freq: Once | INTRAVENOUS | Status: AC | PRN
  Administered 2015-05-06: 10.7 via INTRAVENOUS

## 2015-05-08 ENCOUNTER — Telehealth: Payer: Self-pay | Admitting: Internal Medicine

## 2015-05-08 NOTE — Telephone Encounter (Signed)
He was returning a call,concerning his stress test results.

## 2015-05-08 NOTE — Telephone Encounter (Signed)
Results given, result notes updated for communication.

## 2016-02-12 ENCOUNTER — Encounter: Payer: Self-pay | Admitting: Internal Medicine

## 2016-02-12 ENCOUNTER — Ambulatory Visit (INDEPENDENT_AMBULATORY_CARE_PROVIDER_SITE_OTHER): Payer: BLUE CROSS/BLUE SHIELD | Admitting: Internal Medicine

## 2016-02-12 VITALS — BP 130/68 | HR 62 | Ht 69.0 in | Wt 201.4 lb

## 2016-02-12 DIAGNOSIS — E785 Hyperlipidemia, unspecified: Secondary | ICD-10-CM

## 2016-02-12 DIAGNOSIS — I10 Essential (primary) hypertension: Secondary | ICD-10-CM

## 2016-02-12 DIAGNOSIS — Z951 Presence of aortocoronary bypass graft: Secondary | ICD-10-CM

## 2016-02-12 NOTE — Patient Instructions (Signed)
Your physician wants you to follow-up in: ONE YEAR with Dr. Hilty. You will receive a reminder letter in the mail two months in advance. If you don't receive a letter, please call our office to schedule the follow-up appointment.  

## 2016-02-12 NOTE — Progress Notes (Signed)
OFFICE NOTE  Chief Complaint:  No complaints, annual visit  Primary Care Physician: Stephens Shire, MD  HPI:  Patrick Stout is a pleasant 65 year old gentleman status post bypass surgery with LIMA to LAD, RIMA to the OM and SVG to D1, SVG to PDA, and sequential SVG to PDA in April of 2011. Overall, he is doing very well, continues to work and exercise without any significant issues. He has a Physiological scientist and exercises with him at least once or twice a week. Is also active in hiking and competes in some hiking activities with his dog. 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 Based on that, we are aggressively treating his cholesterol and is currently on Crestor 40 mg daily. He is also on a low-dose ACE inhibitor, beta-blocker, aspirin and other over-the-counter medications.   Patrick Stout returns today in followup. He is doing extremely well. He is significantly increased his exercise. He appears to be in very good shape. He has lost another 20 pounds as he was borderline diabetic. His blood sugars have responded appropriately. Blood pressure is at goal. He reportedly had cholesterol testing through his primary care provider and we will go head and obtain those labs today. I did talk about possibly testing for particle numbers which would be helpful given his genetic history to make sure that we have optimally controlled his cholesterol. Again he remains asymptomatic and does a significant amount of activity.  Patrick Stout returns today for follow-up. He seems to be continued do well from a physical standpoint. Unfortunately he's under significant stress. His wife who has early Parkinson's recently became septic from a kidney stone and is having problems with short-term memory loss. He also recently lost his mother. He continues to be  physically active and exercises very regularly. He appears to be in good physical shape. His EKG shows no ischemic changes. He recently had cholesterol testing from his primary care provider which demonstrated a total cholesterol of 136, triglycerides 47, HDL 55 and LDL 72.  02/12/2016  Patrick Stout returns today for follow-up. Over the past year he's done well. He denies any chest pain or worsening shortness of breath. He continues to exercise regularly. He had blood work from his primary care provider in June 2017. Total cholesterol is 129, triglycerides 72, HDL 42 and LDL was not reported but is very low. He's gained a small amount of weight due to less activity. Unfortunately he's taking care of his wife who has Lewy body dementia and is unfortunately declining. He's had to retire from work to take care of her. He seems stable from a cardiac standpoint.  PMHx:  Past Medical History:  Diagnosis Date  . 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 Family history of heart disease   . History of nuclear stress test 10/01/2009   bruce protocol; ekg changes positive for ischemia - L circumflex (subsequent cath & CABG)  . Hypercholesterolemia   . Hypertension     Past Surgical History:  Procedure Laterality Date  . CORONARY ARTERY BYPASS GRAFT  10/04/2009   LIMA to LAD, RIMA to OM, SVG to D1, SVG to PDA, sequential SVG to PDA (Dr. Roxy Manns)  . KNEE SURGERY  2008  . TRANSTHORACIC ECHOCARDIOGRAM  10/04/2009   EF 55-60%  FAMHx:  Family History  Problem Relation Age of Onset  . Heart attack Father   . Coronary artery disease Father   . Esophageal cancer Sister   . Hypertension Sister   . COPD Sister     SOCHx:   reports that he has never smoked. He has never used smokeless tobacco. He reports that he drinks alcohol. He reports that he does not use drugs.  ALLERGIES:  No Known Allergies  ROS: A comprehensive review of systems  was negative.  HOME MEDS: Current Outpatient Prescriptions  Medication Sig Dispense Refill  . aspirin 81 MG chewable tablet Chew 162 mg by mouth daily.     . CRESTOR 40 MG tablet TAKE 1 TABLET BY MOUTH DAILY. 30 tablet 11  . EVENING PRIMROSE OIL PO Take 1 tablet by mouth daily.    . Flaxseed, Linseed, (FLAXSEED OIL) 1000 MG CAPS Take by mouth daily.    . folic acid (FOLVITE) 1 MG tablet TAKE 1 TABLET BY MOUTH EVERY DAY 30 tablet 11  . metoprolol tartrate (LOPRESSOR) 25 MG tablet Take 25 mg by mouth 3 (three) times daily.     . Multiple Vitamin (MULTIVITAMIN WITH MINERALS) TABS Take 1 tablet by mouth daily.    . ramipril (ALTACE) 5 MG capsule TAKE ONE CAPSULE BY MOUTH EVERY DAY 90 capsule 2  . tamsulosin (FLOMAX) 0.4 MG CAPS capsule Take 1 capsule by mouth daily.     No current facility-administered medications for this visit.     LABS/IMAGING: No results found for this or any previous visit (from the past 48 hour(s)). No results found.  VITALS: BP 130/68 (BP Location: Left Arm, Patient Position: Sitting, Cuff Size: Normal)   Pulse 62   Ht 5\' 9"  (1.753 m)   Wt 201 lb 6 oz (91.3 kg)   BMI 29.74 kg/m   EXAM: General appearance: alert and no distress Neck: no adenopathy, no carotid bruit, no JVD, supple, symmetrical, trachea midline and thyroid not enlarged, symmetric, no tenderness/mass/nodules Lungs: clear to auscultation bilaterally Heart: regular rate and rhythm, S1, S2 normal, no murmur, click, rub or gallop Abdomen: soft, non-tender; bowel sounds normal; no masses,  no organomegaly Extremities: extremities normal, atraumatic, no cyanosis or edema Pulses: 2+ and symmetric Skin: Skin color, texture, turgor normal. No rashes or lesions Neurologic: Grossly normal  EKG: Sinus rhythm with first-degree AV block at 63  ASSESSMENT: 1. Coronary artery disease status post 5 vessel CABG in 2011 - asymptomatic 2. Strong genetics for coronary disease 3. Dyslipidemia - at  goal 4. Hypertension - at goal  PLAN: 1.   Patrick Stout is doing very well and has no cardiac complaints. Cholesterol blood pressure at goal. He's gained a small amount of weight but plans to start doing more activity and exercise now that he is retired and unfortunately has to take care of his wife who is suffering from regressive dementia. Plan to see him annually or sooner as necessary.  Pixie Casino, MD, [facility] Attending Cardiologist [place] [facility] 02/12/2016, 9:12 AM

## 2016-03-27 ENCOUNTER — Other Ambulatory Visit: Payer: Self-pay | Admitting: Internal Medicine

## 2016-08-06 ENCOUNTER — Encounter (HOSPITAL_COMMUNITY): Payer: Self-pay | Admitting: Emergency Medicine

## 2016-08-06 ENCOUNTER — Emergency Department (HOSPITAL_COMMUNITY): Payer: BLUE CROSS/BLUE SHIELD

## 2016-08-06 ENCOUNTER — Emergency Department (HOSPITAL_COMMUNITY)
Admission: EM | Admit: 2016-08-06 | Discharge: 2016-08-06 | Disposition: A | Discharge status: Home / Self Care | Payer: BLUE CROSS/BLUE SHIELD | Attending: Physician Assistant | Admitting: Physician Assistant

## 2016-08-06 DIAGNOSIS — R339 Retention of urine, unspecified: Secondary | ICD-10-CM | POA: Diagnosis present

## 2016-08-06 DIAGNOSIS — I10 Essential (primary) hypertension: Secondary | ICD-10-CM | POA: Insufficient documentation

## 2016-08-06 DIAGNOSIS — R109 Unspecified abdominal pain: Secondary | ICD-10-CM | POA: Diagnosis not present

## 2016-08-06 DIAGNOSIS — Z951 Presence of aortocoronary bypass graft: Secondary | ICD-10-CM | POA: Diagnosis not present

## 2016-08-06 DIAGNOSIS — Z7982 Long term (current) use of aspirin: Secondary | ICD-10-CM | POA: Diagnosis not present

## 2016-08-06 DIAGNOSIS — I25810 Atherosclerosis of coronary artery bypass graft(s) without angina pectoris: Secondary | ICD-10-CM | POA: Insufficient documentation

## 2016-08-06 LAB — COMPREHENSIVE METABOLIC PANEL
ALT: 24 U/L (ref 17–63)
AST: 26 U/L (ref 15–41)
Albumin: 4 g/dL (ref 3.5–5.0)
Alkaline Phosphatase: 49 U/L (ref 38–126)
Anion gap: 7 (ref 5–15)
BUN: 13 mg/dL (ref 6–20)
CO2: 28 mmol/L (ref 22–32)
Calcium: 9.6 mg/dL (ref 8.9–10.3)
Chloride: 104 mmol/L (ref 101–111)
Creatinine, Ser: 1.03 mg/dL (ref 0.61–1.24)
GFR calc Af Amer: >60 mL/min (ref >60)
GFR calc non Af Amer: >60 mL/min (ref >60)
Glucose, Bld: 111 mg/dL — ABNORMAL HIGH (ref 65–99)
Potassium: 4.6 mmol/L (ref 3.5–5.1)
Sodium: 139 mmol/L (ref 135–145)
Total Bilirubin: 1 mg/dL (ref 0.3–1.2)
Total Protein: 6.8 g/dL (ref 6.5–8.1)

## 2016-08-06 LAB — CBC WITH DIFFERENTIAL/PLATELET
Basophils Absolute: 0 10*3/uL (ref 0.0–0.1)
Basophils Relative: 0 %
Eosinophils Absolute: 0.1 10*3/uL (ref 0.0–0.7)
Eosinophils Relative: 1 %
HCT: 41.5 % (ref 39.0–52.0)
Hemoglobin: 14.4 g/dL (ref 13.0–17.0)
Lymphocytes Relative: 19 %
Lymphs Abs: 1.6 10*3/uL (ref 0.7–4.0)
MCH: 31.3 pg (ref 26.0–34.0)
MCHC: 34.7 g/dL (ref 30.0–36.0)
MCV: 90.2 fL (ref 78.0–100.0)
Monocytes Absolute: 0.3 10*3/uL (ref 0.1–1.0)
Monocytes Relative: 4 %
Neutro Abs: 6.7 10*3/uL (ref 1.7–7.7)
Neutrophils Relative %: 76 %
Platelets: 153 10*3/uL (ref 150–400)
RBC: 4.6 MIL/uL (ref 4.22–5.81)
RDW: 12.7 % (ref 11.5–15.5)
WBC: 8.7 10*3/uL (ref 4.0–10.5)

## 2016-08-06 LAB — URINALYSIS, ROUTINE W REFLEX MICROSCOPIC
Bilirubin Urine: NEGATIVE
Glucose, UA: NEGATIVE mg/dL
Ketones, ur: NEGATIVE mg/dL
Leukocytes, UA: NEGATIVE
Nitrite: NEGATIVE
Protein, ur: NEGATIVE mg/dL
Specific Gravity, Urine: 1.015 (ref 1.005–1.030)
Squamous Epithelial / LPF: NONE SEEN
pH: 5 (ref 5.0–8.0)

## 2016-08-06 NOTE — ED Provider Notes (Signed)
[facility] DEPT Provider Note   CSN: [id number] Arrival date & time: 08/06/16  0536     History   Chief Complaint Chief Complaint  Patient presents with  . Urinary Retention    HPI Patrick Stout is a 66 y.o. male.  HPI   Patient is a 66 year old male presenting with urinary retention. Patient has been checking his PSA as an outpatient with Dr. Delora Fuel from [facility]. He noticed a slight increase in his PSA last week and started him on Cipro. For the last several days is having increasingly difficulty urinating. Today he is found to have 100 mL retained urine. He also has right CVA pain that is mild in nature.  Past Medical History:  Diagnosis Date  . 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 Family history of heart disease   . History of nuclear stress test 10/01/2009   bruce protocol; ekg changes positive for ischemia - L circumflex (subsequent cath & CABG)  . Hypercholesterolemia   . Hypertension     Patient Active Problem List   Diagnosis Date Noted  . 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 Dyslipidemia 03/05/2013  . HTN (hypertension) 03/05/2013    Past Surgical History:  Procedure Laterality Date  . CORONARY ARTERY BYPASS GRAFT  10/04/2009   LIMA to LAD, RIMA to OM, SVG to D1, SVG to PDA, sequential SVG to PDA (Dr. Roxy Manns)  . KNEE SURGERY  2008  . TRANSTHORACIC ECHOCARDIOGRAM  10/04/2009   EF 55-60%       Home Medications    Prior to Admission medications   Medication Sig Start Date End Date Taking? Authorizing Provider  aspirin 81 MG chewable tablet Chew 162 mg by mouth daily.     Historical Provider, MD  CRESTOR 40 MG tablet TAKE 1 TABLET BY MOUTH DAILY. 02/18/15   Pixie Casino, MD  EVENING PRIMROSE OIL PO Take 1 tablet by mouth daily.    Historical Provider, MD  Flaxseed, Linseed, (FLAXSEED OIL) 1000 MG CAPS Take by mouth daily.    Historical Provider, MD  folic acid (FOLVITE) 1 MG  tablet TAKE 1 TABLET BY MOUTH EVERY DAY 03/29/16   Pixie Casino, MD  metoprolol tartrate (LOPRESSOR) 25 MG tablet Take 25 mg by mouth 3 (three) times daily.     Historical Provider, MD  Multiple Vitamin (MULTIVITAMIN WITH MINERALS) TABS Take 1 tablet by mouth daily.    Historical Provider, MD  ramipril (ALTACE) 5 MG capsule TAKE ONE CAPSULE BY MOUTH EVERY DAY 05/27/14   Pixie Casino, MD  tamsulosin (FLOMAX) 0.4 MG CAPS capsule Take 1 capsule by mouth daily. 02/14/14   Historical Provider, MD    Family History Family History  Problem Relation Age of Onset  . Heart attack Father   . Coronary artery disease Father   . Esophageal cancer Sister   . Hypertension Sister   . COPD Sister     Social History Social History  Substance Use Topics  . Smoking status: Never Smoker  . Smokeless tobacco: Never Used  . Alcohol use Yes     Comment: socially     Allergies   Patient has no known allergies.   Review of Systems Review of Systems  Constitutional: Negative for activity change.  Respiratory: Negative for shortness of breath.   Cardiovascular: Negative for chest pain.  Gastrointestinal: Negative for abdominal pain.  Genitourinary: Positive for decreased urine volume and  difficulty urinating.  All other systems reviewed and are negative.    Physical Exam Updated Vital Signs BP 123/88   Pulse 63   Temp 97.5 F (36.4 C) (Oral)   Resp 16   Ht 5\' 8"  (1.727 m)   Wt 195 lb (88.5 kg)   SpO2 97%   BMI 29.65 kg/m   Physical Exam  Constitutional: He is oriented to person, place, and time. He appears well-nourished.  HENT:  Head: Normocephalic.  Eyes: Conjunctivae are normal.  Cardiovascular: Normal rate and regular rhythm.   No murmur heard. Pulmonary/Chest: Effort normal and breath sounds normal. No respiratory distress.  Genitourinary:  Genitourinary Comments: Catheter in place  Neurological: He is oriented to person, place, and time. No cranial nerve deficit.    Skin: Skin is warm and dry. He is not diaphoretic.  Psychiatric: He has a normal mood and affect. His behavior is normal.     ED Treatments / Results  Labs (all labs ordered are listed, but only abnormal results are displayed) Labs Reviewed  URINALYSIS, ROUTINE W REFLEX MICROSCOPIC - Abnormal; Notable for the following:       Result Value   Hgb urine dipstick MODERATE (*)    Bacteria, UA RARE (*)    All other components within normal limits  URINE CULTURE  COMPREHENSIVE METABOLIC PANEL  CBC WITH DIFFERENTIAL/PLATELET    EKG  EKG Interpretation None       Radiology No results found.  Procedures Procedures (including critical care time)  Medications Ordered in ED Medications - No data to display   Initial Impression / Assessment and Plan / ED Course  I have reviewed the triage vital signs and the nursing notes.  Pertinent labs & imaging results that were available during my care of the patient were reviewed by me and considered in my medical decision making (see chart for details).     Patient is a 66 year old male presents with urinary retention. Given patient's increasing PSA in the right CVA tenderness we'll do CT for stone and check labs as well. We will keep Foley in place and have follow up with urology.  10:40 AM CT and labs reassuring. Patient told about his large prostate and will follow up with urology this week.  Final Clinical Impressions(s) / ED Diagnoses   Final diagnoses:  None    New Prescriptions New Prescriptions   No medications on file     Courteney Julio Alm, MD 08/06/16 1040

## 2016-08-06 NOTE — ED Notes (Signed)
Pt transported to CT ?

## 2016-08-06 NOTE — ED Triage Notes (Signed)
Patient with urinary retention for the last 24 hours.  Patient has been taking Cipro due to enlarged prostate, given to him by Dr Rosana Hoes.  Has not been urinating as much in the last few days and then stopped urinating yesterday, having minimal dribbling.

## 2016-08-06 NOTE — Discharge Instructions (Signed)
Please follow-up with your urologist about the enlarged prostate. Please keep the leg bag on until follow-up in 7 days.

## 2016-08-07 LAB — URINE CULTURE: Culture: NO GROWTH

## 2016-11-06 ENCOUNTER — Encounter: Payer: Self-pay | Admitting: Internal Medicine

## 2016-11-12 ENCOUNTER — Encounter: Payer: Self-pay | Admitting: Physician Assistant

## 2016-11-12 ENCOUNTER — Ambulatory Visit (INDEPENDENT_AMBULATORY_CARE_PROVIDER_SITE_OTHER): Payer: BLUE CROSS/BLUE SHIELD | Admitting: Physician Assistant

## 2016-11-12 VITALS — BP 118/64 | HR 52 | Ht 68.0 in | Wt 197.0 lb

## 2016-11-12 DIAGNOSIS — Z951 Presence of aortocoronary bypass graft: Secondary | ICD-10-CM | POA: Diagnosis not present

## 2016-11-12 DIAGNOSIS — Z01810 Encounter for preprocedural cardiovascular examination: Secondary | ICD-10-CM | POA: Diagnosis not present

## 2016-11-12 DIAGNOSIS — I10 Essential (primary) hypertension: Secondary | ICD-10-CM | POA: Diagnosis not present

## 2016-11-12 DIAGNOSIS — E785 Hyperlipidemia, unspecified: Secondary | ICD-10-CM

## 2016-11-12 DIAGNOSIS — I25810 Atherosclerosis of coronary artery bypass graft(s) without angina pectoris: Secondary | ICD-10-CM

## 2016-11-12 NOTE — Patient Instructions (Addendum)
Medication Instructions:   No change  Labwork:   none  Testing/Procedures:  none  Follow-Up:  1 year with Dr. Theresa Duty are cleared for surgery - we will forward an operative clearance note to Dr. Brendia Sacks  If you need a refill on your cardiac medications before your next appointment, please call your pharmacy.

## 2016-11-12 NOTE — Progress Notes (Signed)
Cardiology Office Note    Date:  11/13/2016   ID:  Can, Lucci 1951/01/01, MRN [id number]  PCP:  [facility]  Cardiologist:  Dr. Debara Pickett  Chief Complaint  Patient presents with  . Follow-up    surgical clearance for prostectomy   Mr. Patrick Stout presents for surgerical preoperative clearance at the request of Dr. Brendia Sacks for possible robotic assisted prostectomy  History of Present Illness:  Patrick Stout is a 66 y.o. male with PMH of CAD s/p CABG with LIMA to LAD, RIMA to OM, SVG to D1, SVG to PDA, and sequential SVG to PDA in 09/2009. 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 He was last seen by Dr. Debara Pickett on 02/12/2016. He was doing very well at the time. He was dealing with family issues as his wife had Lewy body dementia and declining mental status. He had a Myoview on 05/06/2015 which showed EF 56%, hypertensive response of blood pressure to exercise, otherwise no ischemia or infarction. He was recently seen in the ED on 08/06/2016 for urinary retention. He has been followed by Dr. Brendia Sacks of urology service of [facility]. He is scheduled for robotic-assisted prostatectomy. He presents today for preop clearance.  Since last his last office visit, he has not had any issues. His wife is also doing better. He continued to exercise very frequently. He has not noticed significant chest discomfort or shortness breath. His EKG showed no significant changes either. On physical exam, he does not have any heart failure signs. He denies any recent lower extremity edema, orthopnea or PND. He is cleared to proceed with surgery. He is a low risk patient with intended procedure.    Past Medical History:  Diagnosis Date  . Coronary artery disease    CABG; abnormal genetic CV profile (APO E  genotype 3/4, positive 9P21 translocation)  . Family history of heart disease   . History of nuclear stress test 10/01/2009   bruce protocol; ekg changes positive for ischemia - L circumflex (subsequent cath & CABG)  . Hypercholesterolemia   . Hypertension     Past Surgical History:  Procedure Laterality Date  . CORONARY ARTERY BYPASS GRAFT  10/04/2009   LIMA to LAD, RIMA to OM, SVG to D1, SVG to PDA, sequential SVG to PDA (Dr. Roxy Manns)  . KNEE SURGERY  2008  . TRANSTHORACIC ECHOCARDIOGRAM  10/04/2009   EF 55-60%    Current Medications: Outpatient Medications Prior to Visit  Medication Sig Dispense Refill  . aspirin 81 MG chewable tablet Chew 162 mg by mouth daily.     . ciprofloxacin (CIPRO) 500 MG tablet Take 500 mg by mouth 2 (two) times daily.  0  . CRESTOR 40 MG tablet TAKE 1 TABLET BY MOUTH DAILY. 30 tablet 11  . EVENING PRIMROSE OIL PO Take 1 tablet by mouth 2 (two) times daily.     . Flaxseed, Linseed, (FLAXSEED OIL) 1000 MG CAPS Take 2 capsules by mouth 2 (two) times daily.     . folic acid (FOLVITE) 1 MG tablet TAKE 1 TABLET BY MOUTH EVERY DAY 30 tablet 11  . metoprolol tartrate (LOPRESSOR) 25 MG tablet Take 25 mg by mouth 2 (two) times daily.     . Multiple Vitamin (MULTIVITAMIN WITH MINERALS) TABS Take 1 tablet by mouth daily.    Marland Kitchen  ramipril (ALTACE) 5 MG capsule Take 5 mg by mouth daily.    . tamsulosin (FLOMAX) 0.4 MG CAPS capsule Take 1 capsule by mouth daily.    . ramipril (ALTACE) 5 MG capsule TAKE ONE CAPSULE BY MOUTH EVERY DAY (Patient not taking: Reported on 08/06/2016) 90 capsule 2   No facility-administered medications prior to visit.      Allergies:   Patient has no known allergies.   Social History   Social History  . Marital status: Single    Spouse name: N/A  . Number of children: 2  . Years of education: N/A   Occupational History  . 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 Smoking status: Never Smoker  . Smokeless  tobacco: Never Used  . Alcohol use Yes     Comment: socially  . Drug use: No  . Sexual activity: Not Asked   Other Topics Concern  . None   Social History Narrative  . None     Family History:  The patient's family history includes COPD in his sister; Coronary artery disease in his father; Esophageal cancer in his sister; Heart attack in his father; Hypertension in his sister.   ROS:   Please see the history of present illness.    ROS All other systems reviewed and are negative.   PHYSICAL EXAM:   VS:  BP 118/64   Pulse (!) 52   Ht 5\' 8"  (1.727 m)   Wt 197 lb (89.4 kg)   BMI 29.95 kg/m    GEN: Well nourished, well developed, in no acute distress  HEENT: normal  Neck: no JVD, carotid bruits, or masses Cardiac: RRR; no murmurs, rubs, or gallops,no edema  Respiratory:  clear to auscultation bilaterally, normal work of breathing GI: soft, nontender, nondistended, + BS MS: no deformity or atrophy  Skin: warm and dry, no rash Neuro:  Alert and Oriented x 3, Strength and sensation are intact Psych: euthymic mood, full affect  Wt Readings from Last 3 Encounters:  11/12/16 197 lb (89.4 kg)  08/06/16 195 lb (88.5 kg)  02/12/16 201 lb 6 oz (91.3 kg)      Studies/Labs Reviewed:   EKG:  EKG is ordered today.  The ekg ordered today demonstrates Sinus rhythm without significant ST-T wave changes.  Recent Labs: 08/06/2016: ALT 24; BUN 13; Creatinine, Ser 1.03; Hemoglobin 14.4; Platelets 153; Potassium 4.6; Sodium 139   Lipid Panel    Component Value Date/Time   CHOL  10/03/2009 0650    164        ATP III CLASSIFICATION:  <200     mg/dL   Desirable  200-239  mg/dL   Borderline High  >=240    mg/dL   High          TRIG 105 10/03/2009 0650   HDL 36 (L) 10/03/2009 0650   CHOLHDL 4.6 10/03/2009 0650   VLDL 21 10/03/2009 0650   LDLCALC (H) 10/03/2009 0650    107        Total Cholesterol/HDL:CHD Risk Coronary Heart Disease Risk Table                     Men   Women  1/2  Average Risk   3.4   3.3  Average Risk       5.0   4.4  2 X Average Risk   9.6   7.1  3 X Average Risk  23.4   11.0  Use the calculated Patient Ratio above and the CHD Risk Table to determine the patient's CHD Risk.        ATP III CLASSIFICATION (LDL):  <100     mg/dL   Optimal  100-129  mg/dL   Near or Above                    Optimal  130-159  mg/dL   Borderline  160-189  mg/dL   High  >190     mg/dL   Very High    Additional studies/ records that were reviewed today include:   Echo 10/04/2009 - Left ventricle: The cavity size was normal. Wall thickness was  normal. Systolic function was normal. The estimated ejection  fraction was in the range of 55% to 60%. - Aortic valve: Trivial regurgitation. - Mitral valve: No evidence of vegetation. - Right atrium: No evidence of thrombus in the atrial cavity or  appendage. - Atrial septum: No defect or patent foramen ovale was identified.  Echo contrast study showed no right-to-left atrial level shunt,  following an increase in RA pressure induced by provocative  maneuvers. - Tricuspid valve: No evidence of vegetation. Impressions:  - Post Bypass evaluation was unchanged. LV showed no wall motion  abnormalities. Trivial central AI noted.   Myoview 05/06/2015 Study Highlights    Nuclear stress EF: 56%.  The left ventricular ejection fraction is normal (55-65%).  Blood pressure demonstrated a hypertensive response to exercise.  There was no ST segment deviation noted during stress.  The study is normal.   Normal stress nuclear study with no ischemia or infarction; EF 56 with normal wall motion.      ASSESSMENT:    1. Preop cardiovascular exam   2. Hypertension, unspecified type   3. S/P CABG x 5   4. Hyperlipidemia, unspecified hyperlipidemia type   5. Coronary artery disease involving coronary bypass graft of native heart without angina pectoris      PLAN:  In order of problems  listed above:  1. Preoperative clearance: He can complete at least 4 METs without any issue, he is cleared to proceed with surgery, he is at relatively low risk patient.  2. CAD s/p CABG: No obvious angina recently. Follow-up in one year with Dr. Debara Pickett.  3. Hypertension: Blood pressure stable.  4. Hyperlipidemia: Lab work being checked by primary care physician according to the patient.    Medication Adjustments/Labs and Tests Ordered: Current medicines are reviewed at length with the patient today.  Concerns regarding medicines are outlined above.  Medication changes, Labs and Tests ordered today are listed in the Patient Instructions below. Patient Instructions  Medication Instructions:   No change  Labwork:   none  Testing/Procedures:  none  Follow-Up:  1 year with Dr. Theresa Duty are cleared for surgery - we will forward an operative clearance note to Dr. Brendia Sacks  If you need a refill on your cardiac medications before your next appointment, please call your pharmacy.      Hilbert Corrigan, [place]  11/13/2016 12:19 AM    [address] [place], [place],   [address] Phone: [phone]; Fax: [phone]

## 2016-11-13 ENCOUNTER — Encounter: Payer: Self-pay | Admitting: Physician Assistant

## 2016-12-10 ENCOUNTER — Other Ambulatory Visit: Payer: Self-pay

## 2016-12-10 MED ORDER — FOLIC ACID 1 MG PO TABS
1.0000 mg | ORAL_TABLET | Freq: Every day | ORAL | 11 refills | Status: DC
Start: 2016-12-10 — End: 2016-12-24

## 2016-12-17 ENCOUNTER — Encounter: Payer: Self-pay | Admitting: Internal Medicine

## 2016-12-24 ENCOUNTER — Other Ambulatory Visit: Payer: Self-pay

## 2016-12-24 MED ORDER — FOLIC ACID 1 MG PO TABS: 1.0000 mg | ORAL_TABLET | Freq: Every day | ORAL | 3 refills | Status: DC

## 2018-01-03 ENCOUNTER — Other Ambulatory Visit: Payer: Self-pay | Admitting: Internal Medicine

## 2018-01-27 ENCOUNTER — Other Ambulatory Visit: Payer: Self-pay | Admitting: Internal Medicine

## 2018-02-19 ENCOUNTER — Other Ambulatory Visit: Payer: Self-pay | Admitting: Internal Medicine

## 2018-03-14 ENCOUNTER — Other Ambulatory Visit: Payer: Self-pay | Admitting: Internal Medicine

## 2018-04-12 ENCOUNTER — Other Ambulatory Visit: Payer: Self-pay | Admitting: Internal Medicine

## 2018-06-12 ENCOUNTER — Other Ambulatory Visit: Payer: Self-pay | Admitting: Internal Medicine

## 2018-06-29 ENCOUNTER — Other Ambulatory Visit: Payer: Self-pay | Admitting: *Deleted

## 2018-06-29 ENCOUNTER — Other Ambulatory Visit: Payer: Self-pay | Admitting: Internal Medicine

## 2018-06-29 MED ORDER — FOLIC ACID 1 MG PO TABS: 1.0000 mg | ORAL_TABLET | Freq: Every day | ORAL | 1 refills | Status: DC

## 2018-06-29 NOTE — Telephone Encounter (Signed)
°*  STAT* If patient is at the pharmacy, call can be transferred to refill team.   1. Which medications need to be refilled? (please list name of each medication and dose if known)  Folic Acid  2. Which pharmacy/location (including street and city if local pharmacy) is medication to be sent to?[facility] [phone]  3. Do they need a 30 day or 90 day supply? Pt needs enough until his appt on 08-08-18

## 2018-07-23 ENCOUNTER — Other Ambulatory Visit: Payer: Self-pay | Admitting: Physician Assistant

## 2018-08-08 ENCOUNTER — Encounter: Payer: Self-pay | Admitting: Internal Medicine

## 2018-08-08 ENCOUNTER — Ambulatory Visit (INDEPENDENT_AMBULATORY_CARE_PROVIDER_SITE_OTHER): Payer: Medicare Other | Admitting: Internal Medicine

## 2018-08-08 VITALS — BP 120/66 | HR 58 | Ht 69.0 in | Wt 197.8 lb

## 2018-08-08 DIAGNOSIS — Z951 Presence of aortocoronary bypass graft: Secondary | ICD-10-CM

## 2018-08-08 DIAGNOSIS — I10 Essential (primary) hypertension: Secondary | ICD-10-CM

## 2018-08-08 DIAGNOSIS — I25810 Atherosclerosis of coronary artery bypass graft(s) without angina pectoris: Secondary | ICD-10-CM | POA: Diagnosis not present

## 2018-08-08 DIAGNOSIS — E785 Hyperlipidemia, unspecified: Secondary | ICD-10-CM | POA: Insufficient documentation

## 2018-08-08 MED ORDER — ASPIRIN 81 MG PO CHEW: 81.0000 mg | CHEWABLE_TABLET | Freq: Every day | ORAL | Status: AC

## 2018-08-08 MED ORDER — ROSUVASTATIN CALCIUM 40 MG PO TABS: 40.0000 mg | ORAL_TABLET | Freq: Every day | ORAL | 3 refills | Status: AC

## 2018-08-08 MED ORDER — METOPROLOL TARTRATE 25 MG PO TABS: 25.0000 mg | ORAL_TABLET | Freq: Two times a day (BID) | ORAL | 3 refills | Status: DC

## 2018-08-08 MED ORDER — RAMIPRIL 5 MG PO CAPS: 5.0000 mg | ORAL_CAPSULE | Freq: Every day | ORAL | 3 refills | Status: DC

## 2018-08-08 NOTE — Patient Instructions (Signed)
Medication Instructions:  Your Physician recommend you continue on your current medication as directed.    If you need a refill on your cardiac medications before your next appointment, please call your pharmacy.   Lab work: None  Testing/Procedures: None  Follow-Up: At [facility], you and your health needs are our priority.  As part of our continuing mission to provide you with exceptional heart care, we have created designated Provider Care Teams.  These Care Teams include your primary Cardiologist (physician) and Advanced Practice Providers (APPs -  Physician Assistants and Nurse Practitioners) who all work together to provide you with the care you need, when you need it. You will need a follow up appointment in 1 years.  Please call our office 2 months in advance to schedule this appointment.  You may see Dr. Hilty or one of the following Advanced Practice Providers on your designated Care Team: Hao Meng, PA-C . Angela Duke, PA-C     

## 2018-08-08 NOTE — Progress Notes (Signed)
OFFICE NOTE  Chief Complaint:  No complaints  Primary Care Physician: [facility]  HPI:  Patrick Stout is a pleasant 68 year old gentleman status post bypass surgery with LIMA to LAD, RIMA to the OM and SVG to D1, SVG to PDA, and sequential SVG to PDA in April of 2011. Overall, he is doing very well, continues to work and exercise without any significant issues. He has a Physiological scientist and exercises with him at least once or twice a week. Is also active in hiking and competes in some hiking activities with his dog. 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 Based on that, we are aggressively treating his cholesterol and is currently on Crestor 40 mg daily. He is also on a low-dose ACE inhibitor, beta-blocker, aspirin and other over-the-counter medications.   Patrick Stout returns today in followup. He is doing extremely well. He is significantly increased his exercise. He appears to be in very good shape. He has lost another 20 pounds as he was borderline diabetic. His blood sugars have responded appropriately. Blood pressure is at goal. He reportedly had cholesterol testing through his primary care provider and we will go head and obtain those labs today. I did talk about possibly testing for particle numbers which would be helpful given his genetic history to make sure that we have optimally controlled his cholesterol. Again he remains asymptomatic and does a significant amount of activity.  Patrick Stout returns today for follow-up. He seems to be continued do well from a physical standpoint. Unfortunately he's under significant stress. His wife who has early Parkinson's recently became septic from a kidney stone and is having problems with short-term memory loss. He also recently lost his mother. He continues to be  physically active and exercises very regularly. He appears to be in good physical shape. His EKG shows no ischemic changes. He recently had cholesterol testing from his primary care provider which demonstrated a total cholesterol of 136, triglycerides 47, HDL 55 and LDL 72.  02/12/2016  Patrick Stout returns today for follow-up. Over the past year he's done well. He denies any chest pain or worsening shortness of breath. He continues to exercise regularly. He had blood work from his primary care provider in June 2017. Total cholesterol is 129, triglycerides 72, HDL 42 and LDL was not reported but is very low. He's gained a small amount of weight due to less activity. Unfortunately he's taking care of his wife who has Lewy body dementia and is unfortunately declining. He's had to retire from work to take care of her. He seems stable from a cardiac standpoint.  08/08/2018  Patrick Stout is seen today in follow-up.  Over the last year he is done well.  He saw Patrick Deforest, PA-C, who cleared him for robotic prostatectomy last year.  This was for significant BPH.  It was not responsive to medications.  He did really well with the surgery and overall is improving significantly.  He is exercising regularly with a trainer.  He appears to be in good shape and denies any significant issues such as chest pain or shortness of breath beyond what is expected with exercise.  He reports his recovery period is fairly quick.  Blood pressures well controlled today.  Weight is actually few pounds lighter however he does appear stronger than last year.  He is lipid profile in November 2019 showed total cholesterol 132, HDL 44, LDL 72 and triglycerides 81 which is renal near his goal LDL of less than 70.  He thinks he can make some changes with his diet as he typically cooks for Patrick Stout who has Parkinson's disease.  PMHx:  Past Medical History:  Diagnosis Date  . Coronary artery disease    CABG; abnormal genetic CV profile (APO E genotype 3/4,  positive 9P21 translocation)  . Family history of heart disease   . History of nuclear stress test 10/01/2009   bruce protocol; ekg changes positive for ischemia - L circumflex (subsequent cath & CABG)  . Hypercholesterolemia   . Hypertension     Past Surgical History:  Procedure Laterality Date  . CORONARY ARTERY BYPASS GRAFT  10/04/2009   LIMA to LAD, RIMA to OM, SVG to D1, SVG to PDA, sequential SVG to PDA (Dr. Roxy Manns)  . KNEE SURGERY  2008  . TRANSTHORACIC ECHOCARDIOGRAM  10/04/2009   EF 55-60%    FAMHx:  Family History  Problem Relation Age of Onset  . Heart attack Father   . Coronary artery disease Father   . Esophageal cancer Sister   . Hypertension Sister   . COPD Sister     SOCHx:   reports that he has never smoked. He has never used smokeless tobacco. He reports current alcohol use. He reports that he does not use drugs.  ALLERGIES:  No Known Allergies  ROS: Pertinent items noted in HPI and remainder of comprehensive ROS otherwise negative.  HOME MEDS: Current Outpatient Medications  Medication Sig Dispense Refill  . aspirin 81 MG chewable tablet Chew 162 mg by mouth daily.     . CRESTOR 40 MG tablet TAKE 1 TABLET BY MOUTH DAILY. 30 tablet 11  . EVENING PRIMROSE OIL PO Take 1 tablet by mouth 2 (two) times daily.     . Flaxseed, Linseed, (FLAXSEED OIL) 1000 MG CAPS Take 2 capsules by mouth 2 (two) times daily.     . folic acid (FOLVITE) 1 MG tablet Take 1 tablet (1 mg total) by mouth daily. KEEP OV. 30 tablet 0  . metoprolol tartrate (LOPRESSOR) 25 MG tablet Take 25 mg by mouth 2 (two) times daily.     . Multiple Vitamin (MULTIVITAMIN WITH MINERALS) TABS Take 1 tablet by mouth daily.    . ramipril (ALTACE) 5 MG capsule Take 5 mg by mouth daily.     No current facility-administered medications for this visit.     LABS/IMAGING: No results found for this or any previous visit (from the past 48 hour(s)). No results found.  VITALS: BP 120/66   Pulse (!) 58    Ht 5\' 9"  (1.753 m)   Wt 197 lb 12.8 oz (89.7 kg)   BMI 29.21 kg/m   EXAM: General appearance: alert and no distress Neck: no adenopathy, no carotid bruit, no JVD, supple, symmetrical, trachea midline and thyroid not enlarged, symmetric, no tenderness/mass/nodules Lungs: clear to auscultation bilaterally Heart: regular rate and rhythm, S1, S2 normal, no murmur, click, rub or gallop Abdomen: soft, non-tender; bowel sounds normal; no masses,  no organomegaly Extremities: extremities normal, atraumatic, no cyanosis or edema Pulses: 2+ and symmetric Skin: Skin color, texture, turgor normal. No rashes or lesions Neurologic: Grossly normal  EKG: Sinus bradycardia first-degree AV block 57, incomplete right bundle branch block-personally reviewed  ASSESSMENT: 1. Coronary artery disease status post 5 vessel CABG in 2011 - asymptomatic 2. Strong genetics for coronary  disease 3. Dyslipidemia - at goal 4. Hypertension - at goal  PLAN: 1.   Mr. Mott continues to do well and exercises regularly.  His diet is not ideal and could be improved somewhat.  He realizes this and is working on that aggressively with his trainer.  He seems asymptomatic.  He is now 9 years post surgery.  His cholesterol and blood pressure are well controlled.  He did note that his blood sugar was creeping up but does not have a diagnosis of diabetes.  Plan follow-up with me annually or sooner as necessary.  Pixie Casino, MD, [facility], [place] Director of the Advanced Lipid Disorders &  Cardiovascular Risk Reduction Clinic Diplomate of the American Board of Clinical Lipidology Attending Cardiologist  Direct Dial: [phone]  Fax: [phone]  Website:  [email or web address] 08/08/2018, 8:11 AM

## 2018-08-08 NOTE — Addendum Note (Signed)
Addended by: Meryl Crutch on: 08/08/2018 08:27 AM   Modules accepted: Orders

## 2018-08-16 ENCOUNTER — Other Ambulatory Visit: Payer: Self-pay | Admitting: Physician Assistant

## 2019-08-09 ENCOUNTER — Ambulatory Visit: Payer: Medicare Other | Admitting: Internal Medicine

## 2019-08-10 ENCOUNTER — Other Ambulatory Visit: Payer: Self-pay | Admitting: Physician Assistant

## 2019-08-21 ENCOUNTER — Telehealth: Payer: Self-pay | Admitting: Internal Medicine

## 2019-08-21 NOTE — Telephone Encounter (Signed)
New message   Patient states that his wife will be coming with him to appt with Dr. Debara Pickett because she has dementia and she is his only caregiver.

## 2019-08-22 NOTE — Telephone Encounter (Signed)
Comment added to appointment notes that wife will be coming.

## 2019-08-23 ENCOUNTER — Ambulatory Visit: Payer: Medicare Other | Admitting: Internal Medicine

## 2019-08-23 ENCOUNTER — Other Ambulatory Visit: Payer: Self-pay

## 2019-08-23 ENCOUNTER — Encounter: Payer: Self-pay | Admitting: Internal Medicine

## 2019-08-23 VITALS — BP 143/76 | HR 60 | Temp 97.7°F | Resp 14 | Ht 69.0 in | Wt 191.4 lb

## 2019-08-23 DIAGNOSIS — Z951 Presence of aortocoronary bypass graft: Secondary | ICD-10-CM | POA: Diagnosis not present

## 2019-08-23 DIAGNOSIS — I25810 Atherosclerosis of coronary artery bypass graft(s) without angina pectoris: Secondary | ICD-10-CM

## 2019-08-23 DIAGNOSIS — E785 Hyperlipidemia, unspecified: Secondary | ICD-10-CM | POA: Diagnosis not present

## 2019-08-23 DIAGNOSIS — I10 Essential (primary) hypertension: Secondary | ICD-10-CM

## 2019-08-23 NOTE — Progress Notes (Signed)
OFFICE NOTE  Chief Complaint:  No complaints  Primary Care Physician: [facility]  HPI:  Patrick Stout is a pleasant 69 year old gentleman status post bypass surgery with LIMA to LAD, RIMA to the OM and SVG to D1, SVG to PDA, and sequential SVG to PDA in April of 2011. Overall, he is doing very well, continues to work and exercise without any significant issues. He has a Physiological scientist and exercises with him at least once or twice a week. Is also active in hiking and competes in some hiking activities with his dog. 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 Based on that, we are aggressively treating his cholesterol and is currently on Crestor 40 mg daily. He is also on a low-dose ACE inhibitor, beta-blocker, aspirin and other over-the-counter medications.   [name] returns today in followup. He is doing extremely well. He is significantly increased his exercise. He appears to be in very good shape. He has lost another 20 pounds as he was borderline diabetic. His blood sugars have responded appropriately. Blood pressure is at goal. He reportedly had cholesterol testing through his primary care provider and we will go head and obtain those labs today. I did talk about possibly testing for particle numbers which would be helpful given his genetic history to make sure that we have optimally controlled his cholesterol. Again he remains asymptomatic and does a significant amount of activity.  [name] returns today for follow-up. He seems to be continued do well from a physical standpoint. Unfortunately he's under significant stress. His wife who has early Parkinson's recently became septic from a kidney stone and is having problems with short-term memory loss. He also recently lost his mother. He continues to be  physically active and exercises very regularly. He appears to be in good physical shape. His EKG shows no ischemic changes. He recently had cholesterol testing from his primary care provider which demonstrated a total cholesterol of 136, triglycerides 47, HDL 55 and LDL 72.  02/12/2016  [name] returns today for follow-up. Over the past year he's done well. He denies any chest pain or worsening shortness of breath. He continues to exercise regularly. He had blood work from his primary care provider in June 2017. Total cholesterol is 129, triglycerides 72, HDL 42 and LDL was not reported but is very low. He's gained a small amount of weight due to less activity. Unfortunately he's taking care of his wife who has Lewy body dementia and is unfortunately declining. He's had to retire from work to take care of her. He seems stable from a cardiac standpoint.  08/08/2018  [name] is seen today in follow-up.  Over the last year he is done well.  He saw Almyra Deforest, PA-C, who cleared him for robotic prostatectomy last year.  This was for significant BPH.  It was not responsive to medications.  He did really well with the surgery and overall is improving significantly.  He is exercising regularly with a trainer.  He appears to be in good shape and denies any significant issues such as chest pain or shortness of breath beyond what is expected with exercise.  He reports his recovery period is fairly quick.  Blood pressures well controlled today.  Weight is actually few pounds lighter however he does appear stronger than last year.  He is lipid profile in November 2019 showed total cholesterol 132, HDL 44, LDL 72 and triglycerides 81 which is renal near his goal LDL of less than 70.  He thinks he can make some changes with his diet as he typically cooks for [place] who has Parkinson's disease.  08/23/2019  [name] returns today for follow-up.  Continues to do well.  He exercises fairly regularly.  He seems to stay pretty well in  good shape.  His cholesterol most recently is up a little with LDL of 83, previously 72.  He is caring for his wife who has Lewy body dementia.  He denies any chest pain.  He is now about 10 years out from his bypass.  Blood pressure was little elevated today.  He says in general is much better than that.  He has no symptoms when he exercises.  PMHx:  Past Medical History:  Diagnosis Date  . 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 Family history of heart disease   . History of nuclear stress test 10/01/2009   bruce protocol; ekg changes positive for ischemia - L circumflex (subsequent cath & CABG)  . Hypercholesterolemia   . Hypertension     Past Surgical History:  Procedure Laterality Date  . CORONARY ARTERY BYPASS GRAFT  10/04/2009   LIMA to LAD, RIMA to OM, SVG to D1, SVG to PDA, sequential SVG to PDA (Dr. Roxy Manns)  . KNEE SURGERY  2008  . TRANSTHORACIC ECHOCARDIOGRAM  10/04/2009   EF 55-60%    FAMHx:  Family History  Problem Relation Age of Onset  . Heart attack Father   . Coronary artery disease Father   . Esophageal cancer Sister   . Hypertension Sister   . COPD Sister     SOCHx:   reports that he has never smoked. He has never used smokeless tobacco. He reports current alcohol use. He reports that he does not use drugs.  ALLERGIES:  No Known Allergies  ROS: Pertinent items noted in HPI and remainder of comprehensive ROS otherwise negative.  HOME MEDS: Current Outpatient Medications  Medication Sig Dispense Refill  . aspirin 81 MG chewable tablet Chew 1 tablet (81 mg total) by mouth daily.    Marland Kitchen EVENING PRIMROSE OIL PO Take 1 tablet by mouth 2 (two) times daily.     . Flaxseed, Linseed, (FLAXSEED OIL) 1000 MG CAPS Take 2 capsules by mouth 2 (two) times daily.     . folic acid (FOLVITE) 1 MG tablet TAKE 1 TABLET BY MOUTH EVERY DAY 30 tablet 0  . metoprolol tartrate (LOPRESSOR) 25 MG tablet Take 1  tablet (25 mg total) by mouth 2 (two) times daily. 180 tablet 3  . Multiple Vitamin (MULTIVITAMIN WITH MINERALS) TABS Take 1 tablet by mouth daily.    . ramipril (ALTACE) 5 MG capsule Take 1 capsule (5 mg total) by mouth daily. 90 capsule 3  . rosuvastatin (CRESTOR) 40 MG tablet Take 1 tablet (40 mg total) by mouth daily. 90 tablet 3   No current facility-administered medications for this visit.    LABS/IMAGING: No results found for this or any previous visit (from the past 48 hour(s)). No results found.  VITALS: BP (!) 143/76   Pulse 60   Temp 97.7 F (36.5 C)   Resp 14   Ht 5\' 9"  (1.753 m)   Wt 191 lb 6.4 oz (86.8 kg)   SpO2 98%  BMI 28.26 kg/m   EXAM: General appearance: alert and no distress Neck: no adenopathy, no carotid bruit, no JVD, supple, symmetrical, trachea midline and thyroid not enlarged, symmetric, no tenderness/mass/nodules Lungs: clear to auscultation bilaterally Heart: regular rate and rhythm, S1, S2 normal, no murmur, click, rub or gallop Abdomen: soft, non-tender; bowel sounds normal; no masses,  no organomegaly Extremities: extremities normal, atraumatic, no cyanosis or edema Pulses: 2+ and symmetric Skin: Skin color, texture, turgor normal. No rashes or lesions Neurologic: Grossly normal  EKG: Normal sinus rhythm 62 incomplete right bundle branch block-personally reviewed  ASSESSMENT: 1. Coronary artery disease status post 5 vessel CABG in 2011 - asymptomatic 2. Strong genetics for coronary disease 3. Dyslipidemia 4. Hypertension  PLAN: 1.   Patrick Stout continues to do well without any chest pain or worsening shortness of breath.  He is now about 10 years after bypass surgery.  His cholesterol is slightly above target and I encouraged him to continue to work on dietary changes.  He says at this point he thinks he is getting too much carbohydrate and not enough protein and will also need to reduce his saturated fats.  Plan follow-up with me annually  or sooner as necessary.  Pixie Casino, MD, [facility], [place] Director of the Advanced Lipid Disorders &  Cardiovascular Risk Reduction Clinic Diplomate of the American Board of Clinical Lipidology Attending Cardiologist  Direct Dial: [phone]  Fax: [phone]  Website:  [email or web address] 08/23/2019, 3:19 PM

## 2019-08-23 NOTE — Patient Instructions (Signed)

## 2019-08-25 ENCOUNTER — Other Ambulatory Visit: Payer: Self-pay | Admitting: Internal Medicine

## 2020-07-07 DIAGNOSIS — I2510 Atherosclerotic heart disease of native coronary artery without angina pectoris: Secondary | ICD-10-CM | POA: Diagnosis not present

## 2020-07-07 DIAGNOSIS — I10 Essential (primary) hypertension: Secondary | ICD-10-CM | POA: Diagnosis not present

## 2020-07-07 DIAGNOSIS — E785 Hyperlipidemia, unspecified: Secondary | ICD-10-CM | POA: Diagnosis not present

## 2020-07-07 DIAGNOSIS — Z0000 Encounter for general adult medical examination without abnormal findings: Secondary | ICD-10-CM | POA: Diagnosis not present

## 2020-08-05 ENCOUNTER — Other Ambulatory Visit (HOSPITAL_BASED_OUTPATIENT_CLINIC_OR_DEPARTMENT_OTHER): Payer: Self-pay | Admitting: Family Medicine

## 2020-08-05 DIAGNOSIS — I999 Unspecified disorder of circulatory system: Secondary | ICD-10-CM

## 2020-08-20 ENCOUNTER — Other Ambulatory Visit: Payer: Self-pay | Admitting: Internal Medicine

## 2020-09-02 ENCOUNTER — Other Ambulatory Visit: Payer: Self-pay

## 2020-09-02 ENCOUNTER — Emergency Department (HOSPITAL_BASED_OUTPATIENT_CLINIC_OR_DEPARTMENT_OTHER): Payer: Medicare Other

## 2020-09-02 ENCOUNTER — Encounter (HOSPITAL_BASED_OUTPATIENT_CLINIC_OR_DEPARTMENT_OTHER): Payer: Self-pay

## 2020-09-02 ENCOUNTER — Emergency Department (HOSPITAL_BASED_OUTPATIENT_CLINIC_OR_DEPARTMENT_OTHER)
Admission: EM | Admit: 2020-09-02 | Discharge: 2020-09-02 | Disposition: A | Discharge status: Home / Self Care | Payer: Medicare Other | Attending: Emergency Medicine | Admitting: Emergency Medicine

## 2020-09-02 DIAGNOSIS — Z79899 Other long term (current) drug therapy: Secondary | ICD-10-CM | POA: Insufficient documentation

## 2020-09-02 DIAGNOSIS — I10 Essential (primary) hypertension: Secondary | ICD-10-CM | POA: Diagnosis not present

## 2020-09-02 DIAGNOSIS — M25571 Pain in right ankle and joints of right foot: Secondary | ICD-10-CM | POA: Diagnosis not present

## 2020-09-02 DIAGNOSIS — Z7982 Long term (current) use of aspirin: Secondary | ICD-10-CM | POA: Insufficient documentation

## 2020-09-02 DIAGNOSIS — I2510 Atherosclerotic heart disease of native coronary artery without angina pectoris: Secondary | ICD-10-CM | POA: Diagnosis not present

## 2020-09-02 DIAGNOSIS — M7989 Other specified soft tissue disorders: Secondary | ICD-10-CM | POA: Diagnosis not present

## 2020-09-02 DIAGNOSIS — S9001XA Contusion of right ankle, initial encounter: Secondary | ICD-10-CM | POA: Diagnosis not present

## 2020-09-02 DIAGNOSIS — X58XXXA Exposure to other specified factors, initial encounter: Secondary | ICD-10-CM | POA: Diagnosis not present

## 2020-09-02 DIAGNOSIS — Z951 Presence of aortocoronary bypass graft: Secondary | ICD-10-CM | POA: Diagnosis not present

## 2020-09-02 DIAGNOSIS — S99911A Unspecified injury of right ankle, initial encounter: Secondary | ICD-10-CM | POA: Diagnosis present

## 2020-09-02 NOTE — ED Provider Notes (Signed)
[facility] Provider Note   CSN: [id number] Arrival date & time: 09/02/20  1345     History Chief Complaint  Patient presents with  . Bleeding/Bruising  . Ankle Pain    Patrick Stout is a 70 y.o. male.  HPI   Patient with significant medical history of CAD CABG x5 vessels in 2011, hypertension presents with complaint of right ankle pain and bruising.  He endorses he woke up this morning and saw a bruise on the medial aspect of his ankle, he states he has some pain with movement, and is tender to palpation along his medial malleolus.  He denies swelling, paresthesias, or weakness in that extremity.  He denies recent trauma to the area, denies history of PEs or DVTs, currently not on hormone therapy, denies recent travels or surgeries.  He has no associated chest pain, shortness of breath, orthopnea, feeling weak or fatigued. He  Endorses this has never happened in the past.  Patient Dors that he is pretty active, does relatively high intensity exercises, never had a stress fracture in the past.  Patient denies alleviating factors.  Patient denies headaches, fevers, chills, shortness of breath, chest pain, abdominal pain, nausea, vomiting, diarrhea, worsening pedal edema.  Past Medical History:  Diagnosis Date  . 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 Family history of heart disease   . History of nuclear stress test 10/01/2009   bruce protocol; ekg changes positive for ischemia - L circumflex (subsequent cath & CABG)  . Hypercholesterolemia   . Hypertension     Patient Active Problem List   Diagnosis Date Noted  . Coronary artery disease involving coronary bypass graft of native heart without angina pectoris 08/08/2018  . Hyperlipidemia 08/08/2018  . 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 Dyslipidemia 03/05/2013  . Hypertension 03/05/2013    Past Surgical History:  Procedure  Laterality Date  . CORONARY ARTERY BYPASS GRAFT  10/04/2009   LIMA to LAD, RIMA to OM, SVG to D1, SVG to PDA, sequential SVG to PDA (Dr. Roxy Manns)  . KNEE SURGERY  2008  . TRANSTHORACIC ECHOCARDIOGRAM  10/04/2009   EF 55-60%       Family History  Problem Relation Age of Onset  . Heart attack Father   . Coronary artery disease Father   . Esophageal cancer Sister   . Hypertension Sister   . COPD Sister     Social History   Tobacco Use  . Smoking status: Never Smoker  . Smokeless tobacco: Never Used  Substance Use Topics  . Alcohol use: Not Currently    Comment: socially  . Drug use: No    Home Medications Prior to Admission medications   Medication Sig Start Date End Date Taking? Authorizing Provider  aspirin 81 MG chewable tablet Chew 1 tablet (81 mg total) by mouth daily. 08/08/18  Yes Hilty, Nadean Corwin, MD  EVENING PRIMROSE OIL PO Take 1 tablet by mouth 2 (two) times daily.    Yes [provider]  Flaxseed, Linseed, (FLAXSEED OIL) 1000 MG CAPS Take 2 capsules by mouth 2 (two) times daily.    Yes [provider]  folic acid (FOLVITE) 1 MG tablet TAKE 1 TABLET BY MOUTH EVERY DAY 08/20/20  Yes Hilty, Nadean Corwin, MD  metoprolol tartrate (LOPRESSOR) 25 MG tablet Take 1 tablet (25 mg total) by mouth 2 (two) times daily. 08/08/18  Yes Hilty, Nadean Corwin, MD  Multiple Vitamin (MULTIVITAMIN WITH MINERALS) TABS Take 1 tablet by mouth daily.   Yes [provider]  ramipril (ALTACE) 5 MG capsule Take 1 capsule (5 mg total) by mouth daily. 08/08/18  Yes Hilty, Nadean Corwin, MD  rosuvastatin (CRESTOR) 40 MG tablet Take 1 tablet (40 mg total) by mouth daily. 08/08/18  Yes Hilty, Nadean Corwin, MD    Allergies    Patient has no known allergies.  Review of Systems   Review of Systems  Constitutional: Negative for chills and fever.  HENT: Negative for congestion and sore throat.   Respiratory: Negative for shortness of breath.   Cardiovascular: Negative for chest pain.   Gastrointestinal: Negative for abdominal pain, diarrhea, nausea and vomiting.  Genitourinary: Negative for enuresis.  Musculoskeletal: Negative for back pain.       Right ankle pain  Skin: Negative for rash.       Right ankle bruising   Neurological: Negative for dizziness.  Hematological: Does not bruise/bleed easily.    Physical Exam Updated Vital Signs BP (!) 173/82 (BP Location: Right Arm)   Pulse (!) 52   Temp 98 F (36.7 C) (Oral)   Resp 18   Ht 5\' 8"  (1.727 m)   Wt 83.9 kg   SpO2 99%   BMI 28.13 kg/m   Physical Exam Vitals and nursing note reviewed.  Constitutional:      General: He is not in acute distress.    Appearance: He is not ill-appearing.  HENT:     Head: Normocephalic and atraumatic.     Nose: No congestion.  Eyes:     Conjunctiva/sclera: Conjunctivae normal.  Cardiovascular:     Rate and Rhythm: Normal rate and regular rhythm.     Pulses: Normal pulses.     Heart sounds: No murmur heard. No friction rub. No gallop.   Pulmonary:     Effort: No respiratory distress.     Breath sounds: No wheezing, rhonchi or rales.  Musculoskeletal:     Right lower leg: No edema.     Left lower leg: No edema.     Comments: Patient's right leg was evaluated, there is noted ecchymosis on the medial aspect distal to the medial malleolus, patient full range of motion at his toes, ankle, knee, neurovascular fully intact, both extremities were warm to the touch.  Patient was slightly tender to palpation along his medial malleolus there is no deformities or crepitus present.  Skin:    General: Skin is warm and dry.     Capillary Refill: Capillary refill takes less than 2 seconds.  Neurological:     Mental Status: He is alert.  Psychiatric:        Mood and Affect: Mood normal.     ED Results / Procedures / Treatments   Labs (all labs ordered are listed, but only abnormal results are displayed) Labs Reviewed - No data to display  EKG None  Radiology DG Ankle  Complete Right  Result Date: 09/02/2020 CLINICAL DATA:  Right leg swelling and bruising to the medial ankle EXAM: RIGHT ANKLE - COMPLETE 3+ VIEW COMPARISON:  None. FINDINGS: Mild soft tissue swelling of the ankle, most pronounced over the medial malleolus. Small joint effusion. No acute bony abnormality. Specifically, no fracture, subluxation, or dislocation. Degenerative spurring noted about the ankle. Additional mild degenerative changes in the mid and hindfoot as imaged. Bidirectional calcaneal spurring noted as well. Vascular calcium in the soft tissues. IMPRESSION: 1. Mild soft tissue swelling of the ankle, most  pronounced medially with a small joint effusion. 2. No acute fracture or traumatic malalignment. 3. Degenerative changes about the ankle and midfoot. Electronically Signed   By: Lovena Le M.D.   On: 09/02/2020 15:22   US Venous Img Lower Unilateral Right  Result Date: 09/02/2020 CLINICAL DATA:  Right leg swelling EXAM: Right LOWER EXTREMITY VENOUS DOPPLER ULTRASOUND TECHNIQUE: Gray-scale sonography with compression, as well as color and duplex ultrasound, were performed to evaluate the deep venous system(s) from the level of the common femoral vein through the popliteal and proximal calf veins. COMPARISON:  None. FINDINGS: VENOUS Normal compressibility of the common femoral, superficial femoral, and popliteal veins, as well as the visualized calf veins. Visualized portions of profunda femoral vein and great saphenous vein unremarkable. No filling defects to suggest DVT on grayscale or color Doppler imaging. Doppler waveforms show normal direction of venous flow, normal respiratory plasticity and response to augmentation. Limited views of the contralateral common femoral vein are unremarkable. OTHER None. Limitations: none IMPRESSION: Negative. Electronically Signed   By: Donavan Foil M.D.   On: 09/02/2020 15:52    Procedures Procedures   Medications Ordered in ED Medications - No data  to display  ED Course  I have reviewed the triage vital signs and the nursing notes.  Pertinent labs & imaging results that were available during my care of the patient were reviewed by me and considered in my medical decision making (see chart for details).    MDM Rules/Calculators/A&P                         Initial impression-patient presents with bruising of his right ankle.  He is alert, does not appear in acute distress, vital signs reassuring.  Will obtain x-ray of ankle as well as DVT study for further evaluation.  Work-up-x-ray negative for fractures or dislocations, does show mild joint effusion on medial aspect.  DVT study is negative  Rule out-I have low suspicion for ACS as patient denies chest pain, shortness of breath, no signs of hypoperfusion fluid overload on my exam.  Low suspicion for PE patient denies pleuritic chest pain, shortness of breath, patient has low risk factors vital signs are reassuring.  Low suspicion for fracture or dislocation of the right ankle as imaging is negative.  Low suspicion for ligament or tendon damage as patient has full range of motion at his toes, ankle, neurovascular fully intact.  Low suspicion for PAD as patient has good pedal pulses, limbs were warm to the touch, there is no noted ischemic skin changes present.  Low suspicion for DVT as study is negative.  Low suspicion for septic arthritis as patient denies IV drug use, there is no surrounding erythema or signs infection noted on exam.  Plan-suspect patient unintentionally injured his right ankle.  Will recommend over-the-counter pain medications like ibuprofen or Tylenol follow-up PCP 1 time for further evaluation.  Vital signs have remained stable, no indication for hospital admission. Patient given at home care as well strict return precautions.  Patient verbalized that they understood agreed to said plan.   Final Clinical Impression(s) / ED Diagnoses Final diagnoses:  Acute right ankle  pain    Rx / DC Orders ED Discharge Orders    None       Marcello Fennel, PA-C 09/02/20 [place], [place], DO 09/04/20 [id number]

## 2020-09-02 NOTE — ED Notes (Signed)
US at bedside

## 2020-09-02 NOTE — Discharge Instructions (Addendum)
I suspect you may have unintentionally injured your right ankle.  I recommend over-the-counter pain medications like ibuprofen and/or Tylenol every 6 hours as needed please follow dosage on the back of bottle.  You may also apply ice to the area or heat as this can help with inflammation and swelling.  I want you to follow-up with your primary care doctor in 1 week's time if symptoms do not fully resolve or bruising increases.  Please come back to emergency department if you develop numbness or tingling in that foot, increased redness or swelling, worsening pain, your symptoms worsen, skin change color or develop ulcers on your leg.

## 2020-09-02 NOTE — ED Triage Notes (Signed)
Pt presents with bruising to medial right ankle and swelling. Denies known injury. States leg "doesn't feel right". Hx of bypass 5 years ago. Denies chest pain

## 2020-09-26 ENCOUNTER — Other Ambulatory Visit (HOSPITAL_COMMUNITY): Payer: Self-pay | Admitting: Family Medicine

## 2020-09-26 DIAGNOSIS — I999 Unspecified disorder of circulatory system: Secondary | ICD-10-CM

## 2020-09-29 ENCOUNTER — Other Ambulatory Visit (HOSPITAL_COMMUNITY): Payer: Self-pay | Admitting: Family Medicine

## 2020-09-29 DIAGNOSIS — I999 Unspecified disorder of circulatory system: Secondary | ICD-10-CM

## 2020-10-16 ENCOUNTER — Other Ambulatory Visit: Payer: Self-pay

## 2020-10-16 ENCOUNTER — Encounter: Payer: Self-pay | Admitting: Internal Medicine

## 2020-10-16 ENCOUNTER — Ambulatory Visit: Payer: Medicare Other | Admitting: Internal Medicine

## 2020-10-16 VITALS — BP 141/81 | HR 60 | Ht 68.0 in | Wt 191.4 lb

## 2020-10-16 DIAGNOSIS — Z951 Presence of aortocoronary bypass graft: Secondary | ICD-10-CM

## 2020-10-16 DIAGNOSIS — E785 Hyperlipidemia, unspecified: Secondary | ICD-10-CM

## 2020-10-16 DIAGNOSIS — I25810 Atherosclerosis of coronary artery bypass graft(s) without angina pectoris: Secondary | ICD-10-CM

## 2020-10-16 DIAGNOSIS — I10 Essential (primary) hypertension: Secondary | ICD-10-CM

## 2020-10-16 NOTE — Progress Notes (Signed)
OFFICE NOTE  Chief Complaint:  No complaints  Primary Care Physician: [facility]  HPI:  Patrick Stout is a pleasant 70 year old gentleman status post bypass surgery with LIMA to LAD, RIMA to the OM and SVG to D1, SVG to PDA, and sequential SVG to PDA in April of 2011. Overall, he is doing very well, continues to work and exercise without any significant issues. He has a Physiological scientist and exercises with him at least once or twice a week. Is also active in hiking and competes in some hiking activities with his dog. 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 Based on that, we are aggressively treating his cholesterol and is currently on Crestor 40 mg daily. He is also on a low-dose ACE inhibitor, beta-blocker, aspirin and other Patrick-the-counter medications.   Patrick Stout returns today in followup. He is doing extremely well. He is significantly increased his exercise. He appears to be in very good shape. He has lost another 20 pounds as he was borderline diabetic. His blood sugars have responded appropriately. Blood pressure is at goal. He reportedly had cholesterol testing through his primary care provider and we will go head and obtain those labs today. I did talk about possibly testing for particle numbers which would be helpful given his genetic history to make sure that we have optimally controlled his cholesterol. Again he remains asymptomatic and does a significant amount of activity.  Patrick Stout returns today for follow-up. He seems to be continued do well from a physical standpoint. Unfortunately he's under significant stress. His wife who has early Parkinson's recently became septic from a kidney stone and is having problems with short-term memory loss. He also recently lost his mother. He continues to be  physically active and exercises very regularly. He appears to be in good physical shape. His EKG shows no ischemic changes. He recently had cholesterol testing from his primary care provider which demonstrated a total cholesterol of 136, triglycerides 47, HDL 55 and LDL 72.  02/12/2016  Patrick Stout returns today for follow-up. Patrick the past year he's done well. He denies any chest pain or worsening shortness of breath. He continues to exercise regularly. He had blood work from his primary care provider in June 2017. Total cholesterol is 129, triglycerides 72, HDL 42 and LDL was not reported but is very low. He's gained a small amount of weight due to less activity. Unfortunately he's taking care of his wife who has Lewy body dementia and is unfortunately declining. He's had to retire from work to take care of her. He seems stable from a cardiac standpoint.  08/08/2018  Patrick Stout is seen today in follow-up.  Patrick the last year he is done well.  He saw Almyra Deforest, PA-C, who cleared him for robotic prostatectomy last year.  This was for significant BPH.  It was not responsive to medications.  He did really well with the surgery and overall is improving significantly.  He is exercising regularly with a trainer.  He appears to be in good shape and denies any significant issues such as chest pain or shortness of breath beyond what is expected with exercise.  He reports his recovery period is fairly quick.  Blood pressures well controlled today.  Weight is actually few pounds lighter however he does appear stronger than last year.  He is lipid profile in November 2019 showed total cholesterol 132, HDL 44, LDL 72 and triglycerides 81 which is renal near his goal LDL of less than 70.  He thinks he can make some changes with his diet as he typically cooks for [place] who has Parkinson's disease.  08/23/2019  Patrick Stout returns today for follow-up.  Continues to do well.  He exercises fairly regularly.  He seems to stay pretty well in  good shape.  His cholesterol most recently is up a little with LDL of 83, previously 72.  He is caring for his wife who has Lewy body dementia.  He denies any chest pain.  He is now about 10 years out from his bypass.  Blood pressure was little elevated today.  He says in general is much better than that.  He has no symptoms when he exercises.  10/16/2020  Patrick Stout is seen today for annual follow-up.  Unfortunately recently he developed soft tissue swelling and discoloration of the right lower leg and ankle.  An x-ray was performed which showed some mild soft tissue swelling.  He may have had some type of spontaneous trauma or bleeding.  He was also noted to have vascular calcification. Doppler was negative for DVT. Subsequently he was ordered by his PCP to get lower extremity arterial Dopplers but he denies any claudication symptoms.  In fact he has excellent peripheral pulses today.  He continues to exercise regularly including weightlifting and working with a Clinical research associate.  He is asymptomatic.  Blood pressure was mildly elevated today.  His cholesterol has been well controlled with LDL 74 in January 2022.  PMHx:  Past Medical History:  Diagnosis Date  . 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 Family history of heart disease   . History of nuclear stress test 10/01/2009   bruce protocol; ekg changes positive for ischemia - L circumflex (subsequent cath & CABG)  . Hypercholesterolemia   . Hypertension     Past Surgical History:  Procedure Laterality Date  . CORONARY ARTERY BYPASS GRAFT  10/04/2009   LIMA to LAD, RIMA to OM, SVG to D1, SVG to PDA, sequential SVG to PDA (Dr. Roxy Manns)  . KNEE SURGERY  2008  . TRANSTHORACIC ECHOCARDIOGRAM  10/04/2009   EF 55-60%    FAMHx:  Family History  Problem Relation Age of Onset  . Heart attack Father   . Coronary artery disease Father   . Esophageal cancer Sister   . Hypertension Sister    . COPD Sister     SOCHx:   reports that he has never smoked. He has never used smokeless tobacco. He reports previous alcohol use. He reports that he does not use drugs.  ALLERGIES:  No Known Allergies  ROS: Pertinent items noted in HPI and remainder of comprehensive ROS otherwise negative.  HOME MEDS: Current Outpatient Medications  Medication Sig Dispense Refill  . aspirin 81 MG chewable tablet Chew 1 tablet (81 mg total) by mouth daily.    Marland Kitchen EVENING PRIMROSE OIL PO Take 1 tablet by mouth 2 (two) times daily.     . Flaxseed, Linseed, (FLAXSEED OIL) 1000 MG CAPS Take 2 capsules by mouth 2 (two) times daily.     . folic acid (FOLVITE) 1 MG tablet TAKE 1 TABLET BY MOUTH EVERY DAY 90 tablet 0  . metoprolol tartrate (LOPRESSOR) 25 MG tablet Take 1 tablet (25 mg total) by mouth 2 (two) times  daily. 180 tablet 3  . Multiple Vitamin (MULTIVITAMIN WITH MINERALS) TABS Take 1 tablet by mouth daily.    . ramipril (ALTACE) 5 MG capsule Take 1 capsule (5 mg total) by mouth daily. 90 capsule 3  . rosuvastatin (CRESTOR) 40 MG tablet Take 1 tablet (40 mg total) by mouth daily. 90 tablet 3   No current facility-administered medications for this visit.    LABS/IMAGING: No results found for this or any previous visit (from the past 48 hour(s)). No results found.  VITALS: BP (!) 141/81   Pulse 60   Ht 5\' 8"  (1.727 m)   Wt 191 lb 6.4 oz (86.8 kg)   SpO2 98%   BMI 29.10 kg/m   EXAM: General appearance: alert and no distress Neck: no adenopathy, no carotid bruit, no JVD, supple, symmetrical, trachea midline and thyroid not enlarged, symmetric, no tenderness/mass/nodules Lungs: clear to auscultation bilaterally Heart: regular rate and rhythm, S1, S2 normal, no murmur, click, rub or gallop Abdomen: soft, non-tender; bowel sounds normal; no masses,  no organomegaly Extremities: extremities normal, atraumatic, no cyanosis or edema Pulses: 2+ and symmetric Skin: Skin color, texture, turgor  normal. No rashes or lesions Neurologic: Grossly normal  EKG: Normal sinus rhythm 60 incomplete right bundle branch block-personally reviewed  ASSESSMENT: 1. Coronary artery disease status post 5 vessel CABG in 2011 - asymptomatic 2. Strong genetics for coronary disease 3. Dyslipidemia 4. Hypertension 5. Peripheral vascular calcification seen on x-ray  PLAN: 1.   Mr. Haslam continues to do well.  He had recent right ankle swelling and injury which showed some vascular calcification.  He scheduled to have lower extremity arterial Dopplers but denies any claudication on exam has excellent peripheral pulses.  While he may have some peripheral vascular disease, I doubt that he has any significant arterial insufficiency.  We will continue his current therapy which also includes aspirin and a statin.  Cholesterol has been very well controlled.  Blood pressure was slightly up today but generally well controlled.  Continue his current activities.  Follow-up with me annually or sooner as necessary.  Pixie Casino, MD, [facility], [place] Director of the [facility] Lipid [facility]  Card[facility]ovascular [facility] Diplomate of the American Board of Clinical Lipidology Attending Cardiologist  Direct Dial: [phone]  Fax: [phone]  Website:  [email or web address] [email or web address] Kamdyn Colborn 10/16/2020, 10:25 AM

## 2020-10-16 NOTE — Patient Instructions (Signed)

## 2020-10-20 ENCOUNTER — Ambulatory Visit (HOSPITAL_COMMUNITY)
Admission: RE | Admit: 2020-10-20 | Discharge: 2020-10-20 | Disposition: A | Discharge status: Home / Self Care | Payer: Medicare Other | Source: Ambulatory Visit | Attending: Family Medicine | Admitting: Family Medicine

## 2020-10-20 ENCOUNTER — Other Ambulatory Visit: Payer: Self-pay

## 2020-10-20 DIAGNOSIS — I999 Unspecified disorder of circulatory system: Secondary | ICD-10-CM

## 2020-10-20 NOTE — Progress Notes (Signed)
VASCULAR LAB    ABIs have been performed.  See CV proc for preliminary results.   Mar Zettler, RVT 10/20/2020, 10:38 AM

## 2020-11-15 DIAGNOSIS — I10 Essential (primary) hypertension: Secondary | ICD-10-CM | POA: Diagnosis not present

## 2020-11-15 DIAGNOSIS — E785 Hyperlipidemia, unspecified: Secondary | ICD-10-CM | POA: Diagnosis not present

## 2020-11-15 DIAGNOSIS — I2510 Atherosclerotic heart disease of native coronary artery without angina pectoris: Secondary | ICD-10-CM | POA: Diagnosis not present

## 2020-11-20 ENCOUNTER — Other Ambulatory Visit: Payer: Self-pay | Admitting: Internal Medicine

## 2021-02-19 ENCOUNTER — Other Ambulatory Visit: Payer: Self-pay | Admitting: Internal Medicine

## 2021-02-23 DIAGNOSIS — I10 Essential (primary) hypertension: Secondary | ICD-10-CM | POA: Diagnosis not present

## 2021-02-23 DIAGNOSIS — E785 Hyperlipidemia, unspecified: Secondary | ICD-10-CM | POA: Diagnosis not present

## 2021-02-23 DIAGNOSIS — I2510 Atherosclerotic heart disease of native coronary artery without angina pectoris: Secondary | ICD-10-CM | POA: Diagnosis not present

## 2021-04-06 DIAGNOSIS — H25813 Combined forms of age-related cataract, bilateral: Secondary | ICD-10-CM | POA: Diagnosis not present

## 2021-04-06 DIAGNOSIS — H43392 Other vitreous opacities, left eye: Secondary | ICD-10-CM | POA: Diagnosis not present

## 2021-04-06 DIAGNOSIS — H524 Presbyopia: Secondary | ICD-10-CM | POA: Diagnosis not present

## 2021-05-17 ENCOUNTER — Other Ambulatory Visit: Payer: Self-pay | Admitting: Internal Medicine

## 2021-06-11 DIAGNOSIS — E785 Hyperlipidemia, unspecified: Secondary | ICD-10-CM | POA: Diagnosis not present

## 2021-06-11 DIAGNOSIS — Z0000 Encounter for general adult medical examination without abnormal findings: Secondary | ICD-10-CM | POA: Diagnosis not present

## 2021-06-11 DIAGNOSIS — I2510 Atherosclerotic heart disease of native coronary artery without angina pectoris: Secondary | ICD-10-CM | POA: Diagnosis not present

## 2021-06-11 DIAGNOSIS — I10 Essential (primary) hypertension: Secondary | ICD-10-CM | POA: Diagnosis not present

## 2021-06-27 ENCOUNTER — Encounter: Payer: Self-pay | Admitting: Internal Medicine

## 2021-08-09 ENCOUNTER — Other Ambulatory Visit: Payer: Self-pay | Admitting: Internal Medicine

## 2021-10-30 NOTE — Progress Notes (Unsigned)
Cardiology Office Note:    Date:  11/03/2021   ID:  ALMANDO BRAWLEY, DOB 04-10-1951, MRN [id number]  PCP:  Pa, [place] Prov[facility] Cardiologist:  Pixie Casino, MD Cardiology APP:  Ledora Bottcher, PA { Referring MD: [facility]   Chief Complaint  Patient presents with   Follow-up    CAD s/p CABG    History of Present Illness:    Patrick Stout is a 71 y.o. male with a hx of CAD s/p CABG: LIMA-LAD, RIMA-OM, SVG-D1, SVG-PDA 09/2009, hyperlipidemia, and hypertension.  He has had genetic testing following coronary artery disease at an early age.  He was last seen by Dr. Debara Pickett May 2022 and was doing well at that time with an LDL of 74.  He presents today for routine annual follow-up. He presents with his wife who has Lewy body dementia. He remains active with exercise and yard work.  He is eating well and maintains low-sodium diet.  He can complete 4.0 METS without angina. Is concerned about bendopnea.  No evidence of heart failure.  He is very interested in protecting his grafts.  He asks about a stress test.  He is doing interval training several times weekly without chest pain.  I am not concerned about progression of disease.   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 Roxy Manns)   [place]  2008   TRANSTHORACIC ECHOCARDIOGRAM  10/04/2009   EF 55-60%    Current Medications: Current Meds  Medication Sig   aspirin 81 MG chewable  tablet Chew 1 tablet (81 mg total) by mouth daily.   EVENING PRIMROSE OIL PO Take 1 tablet by mouth 2 (two) times daily.    Flaxseed, Linseed, (FLAXSEED OIL) 1000 MG CAPS Take 2 capsules by mouth 2 (two) times daily.    folic acid (FOLVITE) 1 MG tablet TAKE 1 TABLET BY MOUTH EVERY DAY   metoprolol tartrate (LOPRESSOR) 25 MG tablet Take 1 tablet (25 mg total) by mouth 2 (two) times daily.   Multiple Vitamin (MULTIVITAMIN WITH MINERALS) TABS Take 1 tablet by mouth daily.   Omega-3 Fatty Acids (FISH OIL) 1000 MG CAPS Take 1,000 mg by mouth in the morning and at bedtime.   ramipril (ALTACE) 5 MG capsule Take 1 capsule (5 mg total) by mouth daily.   rosuvastatin (CRESTOR) 40 MG tablet Take 1 tablet (40 mg total) by mouth daily.     Allergies:   Patient has no known allergies.   Social History   Socioeconomic History   Marital status: Married    Spouse name: Not on file   Number of children: 2   Years of education: Not on file   Highest education level: Not on file  Occupational History   Occupation: interior Games developer: [employer]  Tobacco Use   Smoking status: Never   Smokeless tobacco: Never  Substance  and Sexual Activity   Alcohol use: Not Currently    Comment: socially   Drug use: No   Sexual activity: Not on file  Other Topics Concern   Not on file  Social History Narrative   Not on file   Social Determinants of Health   Financial Resource Strain: Not on file  Food Insecurity: Not on file  Transportation Needs: Not on file  Physical Activity: Not on file  Stress: Not on file  Social Connections: Not on file     Family History: The patient's family history includes COPD in his sister; Coronary artery disease in his father; Esophageal cancer in his sister; Heart attack in his father; Hypertension in his sister.  ROS:   Please see the history of present illness.     All other systems reviewed and are negative.  EKGs/Labs/Other Studies Reviewed:     The following studies were reviewed today:  Myoview 2016 nonischemic  EKG:  EKG is  ordered today.  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  Recent Lipid Panel    Component Value Date/Time   CHOL  10/03/2009 0650    164        ATP III CLASSIFICATION:  <200     mg/dL   Desirable  200-239  mg/dL   Borderline High  >=240    mg/dL   High          TRIG 105 10/03/2009 0650   HDL 36 (L) 10/03/2009 0650   CHOLHDL 4.6 10/03/2009 0650   VLDL 21 10/03/2009 0650   LDLCALC (H) 10/03/2009 0650    107        Total Cholesterol/HDL:CHD Risk Coronary Heart Disease Risk Table                     Men   Women  1/2 Average Risk   3.4   3.3  Average Risk       5.0   4.4  2 X Average Risk   9.6   7.1  3 X Average Risk  23.4   11.0        Use the calculated Patient Ratio above and the CHD Risk Table to determine the patient's CHD Risk.        ATP III CLASSIFICATION (LDL):  <100     mg/dL   Optimal  100-129  mg/dL   Near or Above                    Optimal  130-159  mg/dL   Borderline  160-189  mg/dL   High  >190     mg/dL   Very High     Risk Assessment/Calculations:           Physical Exam:    VS:  BP 130/66 (BP Location: Left Arm, Patient Position: Sitting, Cuff Size: Normal)   Pulse 64   Ht '5\' 8"'$  (1.727 m)   Wt 191 lb 9.6 oz (86.9 kg)   SpO2 97%   BMI 29.13 kg/m     Wt Readings from Last 3 Encounters:  11/03/21 191 lb 9.6 oz (86.9 kg)  10/16/20 191 lb 6.4 oz (86.8 kg)  09/02/20 185 lb (83.9 kg)     GEN:  Well nourished, well developed in no acute distress HEENT: Normal NECK: No JVD; No carotid bruits LYMPHATICS: No lymphadenopathy CARDIAC: RRR, no  murmurs, rubs, gallops RESPIRATORY:  Clear to auscultation without rales, wheezing or rhonchi  ABDOMEN: Soft, non-tender, non-distended MUSCULOSKELETAL:  No edema; No deformity  SKIN: Warm and dry NEUROLOGIC:  Alert  and oriented x 3 PSYCHIATRIC:  Normal affect   ASSESSMENT:    1. Coronary artery disease involving coronary bypass graft of native heart without angina pectoris   2. S/P CABG x 5   3. Hypertension, unspecified type   4. Hyperlipidemia, unspecified hyperlipidemia type    PLAN:    In order of problems listed above:  CAD s/p CABG 2011 Continue aspirin, statin, beta-blocker He asks about a screening Myoview.  Given his activity level without angina, I hesitate to order a test.  I will reach out to Dr. Debara Pickett for review.   Hypertension Maintained on 5 mg ramipril, 25 mg Lopressor twice daily.  BP controlled.  No change in therapy.   Hyperlipidemia goal less than 70 Continue 40 mg Crestor + fish oil  Discussed zetia  Lipid panel 06/11/2021 Total cholesterol 135 HDL 47 Triglycerides 79 LDL 73 We did discuss achieving LDL less than 55.  However he is not diabetic.  I suggested we could try Zetia.  He would like to reach out to Dr. Debara Pickett for advisement.   Keep follow-up in November with Dr. Debara Pickett.      Medication Adjustments/Labs and Tests Ordered: Current medicines are reviewed at length with the patient today.  Concerns regarding medicines are outlined above.  Orders Placed This Encounter  Procedures   EKG 12-Lead   No orders of the defined types were placed in this encounter.   Patient Instructions  Medication Instructions:  No Changes *If you need a refill on your cardiac medications before your next appointment, please call your pharmacy*   Lab Work: No Labs If you have labs (blood work) drawn today and your tests are completely normal, you will receive your results only by: [place] (if you have MyChart) OR A paper copy in the mail If you have any lab test that is abnormal or we need to change your treatment, we will call you to review the results.   Testing/Procedures: No Testing   Follow-Up: At [facility], you and your health needs are our  priority.  As part of our continuing mission to provide you with exceptional heart care, we have created designated Provider Care Teams.  These Care Teams include your primary Cardiologist (physician) and Advanced Practice Providers (APPs -  Physician Assistants and Nurse Practitioners) who all work together to provide you with the care you need, when you need it.  We recommend signing up for the patient portal called "MyChart".  Sign up information is provided on this After Visit Summary.  MyChart is used to connect with patients for Virtual Visits (Telemedicine).  Patients are able to view lab/test results, encounter notes, upcoming appointments, etc.  Non-urgent messages can be sent to your provider as well.   To learn more about what you can do with MyChart, go to [email or web address].    Your next appointment:   Keep Scheduled Appointment  The format for your next appointment:   In Person  Provider:   Pixie Casino, MD       Important Information About Sugar         Signed, Ledora Bottcher, [place]  11/03/2021 10:52 AM    [place]

## 2021-11-01 ENCOUNTER — Other Ambulatory Visit: Payer: Self-pay | Admitting: Internal Medicine

## 2021-11-03 ENCOUNTER — Encounter: Payer: Self-pay | Admitting: Physician Assistant

## 2021-11-03 ENCOUNTER — Ambulatory Visit: Payer: Medicare Other | Admitting: Physician Assistant

## 2021-11-03 VITALS — BP 130/66 | HR 64 | Ht 68.0 in | Wt 191.6 lb

## 2021-11-03 DIAGNOSIS — I10 Essential (primary) hypertension: Secondary | ICD-10-CM | POA: Diagnosis not present

## 2021-11-03 DIAGNOSIS — I25810 Atherosclerosis of coronary artery bypass graft(s) without angina pectoris: Secondary | ICD-10-CM

## 2021-11-03 DIAGNOSIS — E785 Hyperlipidemia, unspecified: Secondary | ICD-10-CM

## 2021-11-03 DIAGNOSIS — Z951 Presence of aortocoronary bypass graft: Secondary | ICD-10-CM

## 2021-11-03 NOTE — Progress Notes (Signed)
I don't think he would be considered "high risk" - LDL goal of 70 or less is adequate. He is close - would focus on diet. No indication for stress testing if he is asymptomatic with exercise.  Thanks for seeing him,  -[place]

## 2021-11-03 NOTE — Patient Instructions (Signed)
Medication Instructions:  No Changes *If you need a refill on your cardiac medications before your next appointment, please call your pharmacy*   Lab Work: No Labs If you have labs (blood work) drawn today and your tests are completely normal, you will receive your results only by: [place] (if you have MyChart) OR A paper copy in the mail If you have any lab test that is abnormal or we need to change your treatment, we will call you to review the results.   Testing/Procedures: No Testing   Follow-Up: At [facility], you and your health needs are our priority.  As part of our continuing mission to provide you with exceptional heart care, we have created designated Provider Care Teams.  These Care Teams include your primary Cardiologist (physician) and Advanced Practice Providers (APPs -  Physician Assistants and Nurse Practitioners) who all work together to provide you with the care you need, when you need it.  We recommend signing up for the patient portal called "MyChart".  Sign up information is provided on this After Visit Summary.  MyChart is used to connect with patients for Virtual Visits (Telemedicine).  Patients are able to view lab/test results, encounter notes, upcoming appointments, etc.  Non-urgent messages can be sent to your provider as well.   To learn more about what you can do with MyChart, go to [email or web address].    Your next appointment:   Keep Scheduled Appointment  The format for your next appointment:   In Person  Provider:   Pixie Casino, MD       Important Information About Sugar

## 2021-12-04 DIAGNOSIS — D1801 Hemangioma of skin and subcutaneous tissue: Secondary | ICD-10-CM | POA: Diagnosis not present

## 2021-12-04 DIAGNOSIS — L821 Other seborrheic keratosis: Secondary | ICD-10-CM | POA: Diagnosis not present

## 2021-12-04 DIAGNOSIS — L814 Other melanin hyperpigmentation: Secondary | ICD-10-CM | POA: Diagnosis not present

## 2021-12-04 DIAGNOSIS — L570 Actinic keratosis: Secondary | ICD-10-CM | POA: Diagnosis not present

## 2021-12-10 DIAGNOSIS — E785 Hyperlipidemia, unspecified: Secondary | ICD-10-CM | POA: Diagnosis not present

## 2021-12-10 DIAGNOSIS — I10 Essential (primary) hypertension: Secondary | ICD-10-CM | POA: Diagnosis not present

## 2021-12-10 DIAGNOSIS — I2510 Atherosclerotic heart disease of native coronary artery without angina pectoris: Secondary | ICD-10-CM | POA: Diagnosis not present

## 2022-01-30 ENCOUNTER — Other Ambulatory Visit: Payer: Self-pay | Admitting: Internal Medicine

## 2022-03-11 ENCOUNTER — Encounter (HOSPITAL_BASED_OUTPATIENT_CLINIC_OR_DEPARTMENT_OTHER): Payer: Self-pay | Admitting: Internal Medicine

## 2022-04-05 DIAGNOSIS — H52201 Unspecified astigmatism, right eye: Secondary | ICD-10-CM | POA: Diagnosis not present

## 2022-04-05 DIAGNOSIS — H524 Presbyopia: Secondary | ICD-10-CM | POA: Diagnosis not present

## 2022-04-05 DIAGNOSIS — H2513 Age-related nuclear cataract, bilateral: Secondary | ICD-10-CM | POA: Diagnosis not present

## 2022-04-05 DIAGNOSIS — H25013 Cortical age-related cataract, bilateral: Secondary | ICD-10-CM | POA: Diagnosis not present

## 2022-06-10 DIAGNOSIS — I2510 Atherosclerotic heart disease of native coronary artery without angina pectoris: Secondary | ICD-10-CM | POA: Diagnosis not present

## 2022-06-10 DIAGNOSIS — R059 Cough, unspecified: Secondary | ICD-10-CM | POA: Diagnosis not present

## 2022-06-10 DIAGNOSIS — I10 Essential (primary) hypertension: Secondary | ICD-10-CM | POA: Diagnosis not present

## 2022-06-10 DIAGNOSIS — E785 Hyperlipidemia, unspecified: Secondary | ICD-10-CM | POA: Diagnosis not present

## 2022-08-12 ENCOUNTER — Encounter: Payer: Self-pay | Admitting: Internal Medicine

## 2022-08-12 ENCOUNTER — Ambulatory Visit: Payer: Medicare Other | Attending: Internal Medicine | Admitting: Internal Medicine

## 2022-08-12 VITALS — BP 138/72 | HR 62 | Ht 68.0 in | Wt 193.4 lb

## 2022-08-12 DIAGNOSIS — I10 Essential (primary) hypertension: Secondary | ICD-10-CM | POA: Diagnosis not present

## 2022-08-12 DIAGNOSIS — E785 Hyperlipidemia, unspecified: Secondary | ICD-10-CM

## 2022-08-12 DIAGNOSIS — Z951 Presence of aortocoronary bypass graft: Secondary | ICD-10-CM | POA: Diagnosis not present

## 2022-08-12 DIAGNOSIS — I25810 Atherosclerosis of coronary artery bypass graft(s) without angina pectoris: Secondary | ICD-10-CM

## 2022-08-12 NOTE — Progress Notes (Signed)
OFFICE NOTE  Chief Complaint:  No complaints  Primary Care Physician: [facility]  HPI:  Patrick Stout is a pleasant 72 year old gentleman status post bypass surgery with LIMA to LAD, RIMA to the OM and SVG to D1, SVG to PDA, and sequential SVG to PDA in April of 2011. Overall, he is doing very well, continues to work and exercise without any significant issues. He has a Physiological scientist and exercises with him at least once or twice a week. Is also active in hiking and competes in some hiking activities with his dog. 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 Based on that, we are aggressively treating his cholesterol and is currently on Crestor 40 mg daily. He is also on a low-dose ACE inhibitor, beta-blocker, aspirin and other over-the-counter medications.   Patrick Stout returns today in followup. He is doing extremely well. He is significantly increased his exercise. He appears to be in very good shape. He has lost another 20 pounds as he was borderline diabetic. His blood sugars have responded appropriately. Blood pressure is at goal. He reportedly had cholesterol testing through his primary care provider and we will go head and obtain those labs today. I did talk about possibly testing for particle numbers which would be helpful given his genetic history to make sure that we have optimally controlled his cholesterol. Again he remains asymptomatic and does a significant amount of activity.  Patrick Stout returns today for follow-up. He seems to be continued do well from a physical standpoint. Unfortunately he's under significant stress. His wife who has early Parkinson's recently became septic from a kidney stone and is having problems with short-term memory loss. He also recently lost his mother. He continues to be  physically active and exercises very regularly. He appears to be in good physical shape. His EKG shows no ischemic changes. He recently had cholesterol testing from his primary care provider which demonstrated a total cholesterol of 136, triglycerides 47, HDL 55 and LDL 72.  02/12/2016  Patrick Stout returns today for follow-up. Over the past year he's done well. He denies any chest pain or worsening shortness of breath. He continues to exercise regularly. He had blood work from his primary care provider in June 2017. Total cholesterol is 129, triglycerides 72, HDL 42 and LDL was not reported but is very low. He's gained a small amount of weight due to less activity. Unfortunately he's taking care of his wife who has Lewy body dementia and is unfortunately declining. He's had to retire from work to take care of her. He seems stable from a cardiac standpoint.  08/08/2018  Patrick Stout is seen today in follow-up.  Over the last year he is done well.  He saw Almyra Deforest, PA-C, who cleared him for robotic prostatectomy last year.  This was for significant BPH.  It was not responsive to medications.  He did really well with the surgery and overall is improving significantly.  He is exercising regularly with a trainer.  He appears to be in good shape and denies any significant issues such as chest pain or shortness of breath beyond what is expected with exercise.  He reports his recovery period is fairly quick.  Blood pressures well controlled today.  Weight is actually few pounds lighter however he does appear stronger than last year.  He  is lipid profile in November 2019 showed total cholesterol 132, HDL 44, LDL 72 and triglycerides 81 which is renal near his goal LDL of less than 70.  He thinks he can make some changes with his diet as he typically cooks for [place] who has Parkinson's disease.  08/23/2019  Patrick Stout returns today for follow-up.  Continues to do well.  He exercises fairly regularly.  He seems to stay pretty well in  good shape.  His cholesterol most recently is up a little with LDL of 83, previously 72.  He is caring for his wife who has Lewy body dementia.  He denies any chest pain.  He is now about 10 years out from his bypass.  Blood pressure was little elevated today.  He says in general is much better than that.  He has no symptoms when he exercises.  10/16/2020  Patrick Stout is seen today for annual follow-up.  Unfortunately recently he developed soft tissue swelling and discoloration of the right lower leg and ankle.  An x-ray was performed which showed some mild soft tissue swelling.  He may have had some type of spontaneous trauma or bleeding.  He was also noted to have vascular calcification. Doppler was negative for DVT. Subsequently he was ordered by his PCP to get lower extremity arterial Dopplers but he denies any claudication symptoms.  In fact he has excellent peripheral pulses today.  He continues to exercise regularly including weightlifting and working with a Clinical research associate.  He is asymptomatic.  Blood pressure was mildly elevated today.  His cholesterol has been well controlled with LDL 74 in January 2022.  08/12/2022  Patrick Stout returns today for follow-up.  Unfortunately his last his wife Patrick Stout.  He has been under quite a lot of stress.  He is of course transitioning and needing to do a lot of the paperwork that is required after death.  He seems to be holding up fairly well.  He feels well denies chest pain or shortness of breath.  Blood pressure is pretty well-controlled although he has had some elevated blood pressures recently.  He brought in a chart which shows an average blood pressure of about 134/66.  He also brought in a diary of his food intake which seems very healthy.  He continues to exercise regularly.  He denies any limitations with this.  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 Roxy Manns)   [place]  2008   TRANSTHORACIC ECHOCARDIOGRAM  10/04/2009   EF 55-60%    FAMHx:  Family History  Problem Relation Age of Onset   Heart attack Father    Coronary artery disease Father    Esophageal cancer Sister    Hypertension Sister    COPD Sister     SOCHx:   reports that he has never smoked. He has never used smokeless tobacco. He reports that he does not currently use alcohol. He reports that he does  not use drugs.  ALLERGIES:  No Known Allergies  ROS: Pertinent items noted in HPI and remainder of comprehensive ROS otherwise negative.  HOME MEDS: Current Outpatient Medications  Medication Sig Dispense Refill   aspirin 81 MG chewable tablet Chew 1 tablet (81 mg total) by mouth daily.     EVENING PRIMROSE OIL PO Take 1 tablet by mouth 2 (two) times daily.      Flaxseed, Linseed, (FLAXSEED OIL) 1000 MG CAPS Take 2 capsules by mouth 2 (two) times daily.      folic acid (FOLVITE) 1 MG tablet TAKE 1 TABLET BY MOUTH EVERY DAY 90 tablet 3   metoprolol tartrate (LOPRESSOR) 25 MG tablet Take 1 tablet (25 mg total) by mouth 2 (two) times daily. 180 tablet 3   Multiple Vitamin (MULTIVITAMIN WITH MINERALS) TABS Take 1 tablet by mouth daily.     Omega-3 Fatty Acids (FISH OIL) 1000 MG CAPS Take 1,000 mg by mouth in the morning and at bedtime.     ramipril (ALTACE) 5 MG capsule Take 1 capsule (5 mg total) by mouth daily. 90 capsule 3   rosuvastatin (CRESTOR) 40 MG tablet Take 1 tablet (40 mg total) by mouth daily. 90 tablet 3   No current facility-administered medications for  this visit.    LABS/IMAGING: No results found for this or any previous visit (from the past 48 hour(s)). No results found.  VITALS: BP 138/72   Pulse 62   Ht '5\' 8"'$  (1.727 m)   Wt 193 lb 6.4 oz (87.7 kg)   SpO2 96%   BMI 29.41 kg/m   EXAM: General appearance: alert and no distress Neck: no adenopathy, no carotid bruit, no JVD, supple, symmetrical, trachea midline and thyroid not enlarged, symmetric, no tenderness/mass/nodules Lungs: clear to auscultation bilaterally Heart: regular rate and rhythm, S1, S2 normal, no murmur, click, rub or gallop Abdomen: soft, non-tender; bowel sounds normal; no masses,  no organomegaly Extremities: extremities normal, atraumatic, no cyanosis or edema Pulses: 2+ and symmetric Skin: Skin color, texture, turgor normal. No rashes or lesions Neurologic: Grossly normal  EKG: Sinus rhythm first-degree AV block at 64-personally reviewed  ASSESSMENT: Coronary artery disease status post 5 vessel CABG in 2011 - asymptomatic Family history of coronary disease Dyslipidemia Hypertension Peripheral vascular calcification seen on x-ray  PLAN: 1.   Mr. Lewey seems to be doing well although his coping with the loss of his wife recently.  His diet continues to be healthy.  He exercises regularly.  He is asymptomatic.  Blood pressure is well-controlled.  His LDL is at target less than 70 based on recent labs.  No changes were recommended to his medications today.  Plan follow-up with me annually or sooner as necessary.  Pixie Casino, MD, [facility], [place] Director of the Advanced Lipid Disorders &  Cardiovascular Risk Reduction Clinic Diplomate of the American Board of Clinical Lipidology Attending Cardiologist  Direct Dial: [phone]  Fax: [phone]  Website:  [email or web address] 08/12/2022, 8:56 AM

## 2022-08-12 NOTE — Patient Instructions (Signed)
Medication Instructions:  The current medical regimen is effective;  continue present plan and medications.  *If you need a refill on your cardiac medications before your next appointment, please call your pharmacy*   Follow-Up: At [facility], you and your health needs are our priority.  As part of our continuing mission to provide you with exceptional heart care, we have created designated Provider Care Teams.  These Care Teams include your primary Cardiologist (physician) and Advanced Practice Providers (APPs -  Physician Assistants and Nurse Practitioners) who all work together to provide you with the care you need, when you need it.  We recommend signing up for the patient portal called "MyChart".  Sign up information is provided on this After Visit Summary.  MyChart is used to connect with patients for Virtual Visits (Telemedicine).  Patients are able to view lab/test results, encounter notes, upcoming appointments, etc.  Non-urgent messages can be sent to your provider as well.   To learn more about what you can do with MyChart, go to [email or web address].    Your next appointment:   12 month(s)  Provider:   Pixie Casino, MD

## 2022-12-06 DIAGNOSIS — L814 Other melanin hyperpigmentation: Secondary | ICD-10-CM | POA: Diagnosis not present

## 2022-12-06 DIAGNOSIS — L821 Other seborrheic keratosis: Secondary | ICD-10-CM | POA: Diagnosis not present

## 2022-12-06 DIAGNOSIS — D225 Melanocytic nevi of trunk: Secondary | ICD-10-CM | POA: Diagnosis not present

## 2022-12-06 DIAGNOSIS — L570 Actinic keratosis: Secondary | ICD-10-CM | POA: Diagnosis not present

## 2022-12-16 DIAGNOSIS — I2510 Atherosclerotic heart disease of native coronary artery without angina pectoris: Secondary | ICD-10-CM | POA: Diagnosis not present

## 2022-12-16 DIAGNOSIS — E785 Hyperlipidemia, unspecified: Secondary | ICD-10-CM | POA: Diagnosis not present

## 2022-12-16 DIAGNOSIS — I10 Essential (primary) hypertension: Secondary | ICD-10-CM | POA: Diagnosis not present

## 2022-12-16 DIAGNOSIS — Z9181 History of falling: Secondary | ICD-10-CM | POA: Diagnosis not present

## 2022-12-16 DIAGNOSIS — Z0000 Encounter for general adult medical examination without abnormal findings: Secondary | ICD-10-CM | POA: Diagnosis not present

## 2023-02-02 ENCOUNTER — Other Ambulatory Visit: Payer: Self-pay | Admitting: Internal Medicine

## 2023-02-24 DIAGNOSIS — N138 Other obstructive and reflux uropathy: Secondary | ICD-10-CM | POA: Diagnosis not present

## 2023-09-01 DIAGNOSIS — H0102B Squamous blepharitis left eye, upper and lower eyelids: Secondary | ICD-10-CM | POA: Diagnosis not present

## 2023-09-01 DIAGNOSIS — H35341 Macular cyst, hole, or pseudohole, right eye: Secondary | ICD-10-CM | POA: Diagnosis not present

## 2023-09-01 DIAGNOSIS — H52223 Regular astigmatism, bilateral: Secondary | ICD-10-CM | POA: Diagnosis not present

## 2023-09-01 DIAGNOSIS — H2513 Age-related nuclear cataract, bilateral: Secondary | ICD-10-CM | POA: Diagnosis not present

## 2023-09-01 DIAGNOSIS — B880 Other acariasis: Secondary | ICD-10-CM | POA: Diagnosis not present

## 2023-09-01 DIAGNOSIS — H524 Presbyopia: Secondary | ICD-10-CM | POA: Diagnosis not present

## 2023-09-01 DIAGNOSIS — H0102A Squamous blepharitis right eye, upper and lower eyelids: Secondary | ICD-10-CM | POA: Diagnosis not present

## 2023-09-01 DIAGNOSIS — H5201 Hypermetropia, right eye: Secondary | ICD-10-CM | POA: Diagnosis not present

## 2023-10-13 ENCOUNTER — Ambulatory Visit: Payer: Medicare Other | Admitting: Internal Medicine

## 2023-10-19 DIAGNOSIS — H0102B Squamous blepharitis left eye, upper and lower eyelids: Secondary | ICD-10-CM | POA: Diagnosis not present

## 2023-10-19 DIAGNOSIS — B880 Other acariasis: Secondary | ICD-10-CM | POA: Diagnosis not present

## 2023-10-19 DIAGNOSIS — H0102A Squamous blepharitis right eye, upper and lower eyelids: Secondary | ICD-10-CM | POA: Diagnosis not present

## 2023-11-01 ENCOUNTER — Ambulatory Visit: Attending: Internal Medicine | Admitting: Internal Medicine

## 2023-11-01 ENCOUNTER — Encounter: Payer: Self-pay | Admitting: Internal Medicine

## 2023-11-01 VITALS — BP 128/70 | Ht 68.0 in | Wt 189.0 lb

## 2023-11-01 DIAGNOSIS — Z951 Presence of aortocoronary bypass graft: Secondary | ICD-10-CM

## 2023-11-01 DIAGNOSIS — E785 Hyperlipidemia, unspecified: Secondary | ICD-10-CM

## 2023-11-01 DIAGNOSIS — I25810 Atherosclerosis of coronary artery bypass graft(s) without angina pectoris: Secondary | ICD-10-CM | POA: Diagnosis not present

## 2023-11-01 DIAGNOSIS — I10 Essential (primary) hypertension: Secondary | ICD-10-CM | POA: Diagnosis not present

## 2023-11-01 NOTE — Patient Instructions (Signed)
 Medication Instructions:  Your physician recommends that you continue on your current medications as directed. Please refer to the Current Medication list given to you today.  *If you need a refill on your cardiac medications before your next appointment, please call your pharmacy*  Follow-Up: At [facility], you and your health needs are our priority.  As part of our continuing mission to provide you with exceptional heart care, our providers are all part of one team.  This team includes your primary Cardiologist (physician) and Advanced Practice Providers or APPs (Physician Assistants and Nurse Practitioners) who all work together to provide you with the care you need, when you need it.  Your next appointment:   IN 1 year with Dr. Maximo Spar

## 2023-11-01 NOTE — Progress Notes (Signed)
 OFFICE NOTE  Chief Complaint:  No complaints  Primary Care Physician: [facility]  HPI:  Patrick Stout is a pleasant 73 year old gentleman status post bypass surgery with LIMA to LAD, RIMA to the OM and SVG to D1, SVG to PDA, and sequential SVG to PDA in April of 2011. Overall, he is doing very well, continues to work and exercise without any significant issues. He has a Systems analyst and exercises with him at least once or twice a week. Is also active in hiking and competes in some hiking activities with his dog. 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 Based on that, we are aggressively treating his cholesterol and is currently on Crestor  40 mg daily. He is also on a low-dose ACE inhibitor, beta-blocker, aspirin  and other over-the-counter medications.   Patrick Stout returns today in followup. He is doing extremely well. He is significantly increased his exercise. He appears to be in very good shape. He has lost another 20 pounds as he was borderline diabetic. His blood sugars have responded appropriately. Blood pressure is at goal. He reportedly had cholesterol testing through his primary care provider and we will go head and obtain those labs today. I did talk about possibly testing for particle numbers which would be helpful given his genetic history to make sure that we have optimally controlled his cholesterol. Again he remains asymptomatic and does a significant amount of activity.  Patrick Stout returns today for follow-up. He seems to be continued do well from a physical standpoint. Unfortunately he's under significant stress. His wife who has early Parkinson's recently became septic from a kidney stone and is having problems with short-term memory loss. He also recently lost his mother. He continues to be  physically active and exercises very regularly. He appears to be in good physical shape. His EKG shows no ischemic changes. He recently had cholesterol testing from his primary care provider which demonstrated a total cholesterol of 136, triglycerides 47, HDL 55 and LDL 72.  02/12/2016  Amelia returns today for follow-up. Over the past year he's done well. He denies any chest pain or worsening shortness of breath. He continues to exercise regularly. He had blood work from his primary care provider in June 2017. Total cholesterol is 129, triglycerides 72, HDL 42 and LDL was not reported but is very low. He's gained a small amount of weight due to less activity. Unfortunately he's taking care of his wife who has Lewy body dementia and is unfortunately declining. He's had to retire from work to take care of her. He seems stable from a cardiac standpoint.  08/08/2018  Patrick Stout is seen today in follow-up.  Over the last year he is done well.  He saw Ervin Heath, PA-C, who cleared him for robotic prostatectomy last year.  This was for significant BPH.  It was not responsive to medications.  He did really well with the surgery and overall is improving significantly.  He is exercising regularly with a trainer.  He appears to be in good shape and denies any significant issues such as chest pain or shortness of breath beyond what is expected with exercise.  He reports his recovery period is fairly quick.  Blood pressures well controlled today.  Weight is actually few pounds lighter however he does appear stronger than last year.  He  is lipid profile in November 2019 showed total cholesterol 132, HDL 44, LDL 72 and triglycerides 81 which is renal near his goal LDL of less than 70.  He thinks he can make some changes with his diet as he typically cooks for [place] who has Parkinson's disease.  08/23/2019  Patrick Stout returns today for follow-up.  Continues to do well.  He exercises fairly regularly.  He seems to stay pretty well in  good shape.  His cholesterol most recently is up a little with LDL of 83, previously 72.  He is caring for his wife who has Lewy body dementia.  He denies any chest pain.  He is now about 10 years out from his bypass.  Blood pressure was little elevated today.  He says in general is much better than that.  He has no symptoms when he exercises.  10/16/2020  Patrick Stout is seen today for annual follow-up.  Unfortunately recently he developed soft tissue swelling and discoloration of the right lower leg and ankle.  An x-ray was performed which showed some mild soft tissue swelling.  He may have had some type of spontaneous trauma or bleeding.  He was also noted to have vascular calcification. Doppler was negative for DVT. Subsequently he was ordered by his PCP to get lower extremity arterial Dopplers but he denies any claudication symptoms.  In fact he has excellent peripheral pulses today.  He continues to exercise regularly including weightlifting and working with a Psychologist, educational.  He is asymptomatic.  Blood pressure was mildly elevated today.  His cholesterol has been well controlled with LDL 74 in January 2022.  08/12/2022  Patrick Stout returns today for follow-up.  Unfortunately his last his wife New Year's Eve.  He has been under quite a lot of stress.  He is of course transitioning and needing to do a lot of the paperwork that is required after death.  He seems to be holding up fairly well.  He feels well denies chest pain or shortness of breath.  Blood pressure is pretty well-controlled although he has had some elevated blood pressures recently.  He brought in a chart which shows an average blood pressure of about 134/66.  He also brought in a diary of his food intake which seems very healthy.  He continues to exercise regularly.  He denies any limitations with this.  11/01/2023  Patrick Stout is seen today in follow-up.  Overall he seems to be doing well.  He gets some occasional shortness of breath when bending over but is able  to do frequent high intensity interval training workouts including weightlifting and gets his heart rate elevated without any incident.  He reports quick heart rate recovery.  Overall he seems to be doing well.  He is coming out of some of the grieving after his wife died about a year and a half ago.  She did have lipid testing in January which showed total cholesterol 130, HDL 47, triglycerides 71 and LDL 68.  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 Alva Jewels)   KNEE SURGERY  2008   TRANSTHORACIC ECHOCARDIOGRAM  10/04/2009   EF 55-60%    FAMHx:  Family History  Problem Relation Age of Onset   Heart attack Father    Coronary artery disease Father    Esophageal cancer Sister    Hypertension Sister    COPD Sister     SOCHx:   reports that he has never smoked. He has never used smokeless tobacco. He reports that he does not currently use alcohol. He reports that he does not use drugs.  ALLERGIES:  No Known Allergies  ROS: Pertinent items noted in HPI and remainder of comprehensive ROS otherwise negative.  HOME MEDS: Current Outpatient Medications  Medication Sig Dispense Refill   aspirin  81 MG chewable tablet Chew 1 tablet (81 mg total) by mouth daily.     EVENING PRIMROSE OIL PO Take 1 tablet by mouth 2 (two) times daily.      Flaxseed, Linseed, (FLAXSEED OIL) 1000 MG CAPS Take 2 capsules by mouth 2 (two) times daily.      folic acid  (FOLVITE ) 1 MG tablet TAKE 1 TABLET BY MOUTH EVERY  DAY 90 tablet 3   metoprolol  tartrate (LOPRESSOR ) 25 MG tablet Take 1 tablet (25 mg total) by mouth 2 (two) times daily. 180 tablet 3   Multiple Vitamin (MULTIVITAMIN WITH MINERALS) TABS Take 1 tablet by mouth daily.     Omega-3 Fatty Acids (FISH OIL) 1000 MG CAPS Take 1,000 mg by mouth in the morning and at bedtime.     ramipril  (ALTACE ) 5 MG capsule Take 1 capsule (5 mg total) by mouth daily. 90 capsule 3   rosuvastatin  (CRESTOR ) 40 MG tablet Take 1 tablet (40 mg total) by mouth daily. 90 tablet 3   No current facility-administered medications for this visit.    LABS/IMAGING: No results found for this or any previous visit (from the past 48 hours). No results found.  VITALS: BP 138/80 (BP Location: Left Arm, Patient Position: Sitting)   Ht 5\' 8"  (1.727 m)   Wt 189 lb (85.7 kg)   SpO2 98%   BMI 28.74 kg/m   EXAM: General appearance: alert and no distress Neck: no adenopathy, no carotid bruit, no JVD, supple, symmetrical, trachea midline and thyroid not enlarged, symmetric, no tenderness/mass/nodules Lungs: clear to auscultation bilaterally Heart: regular rate and rhythm, S1, S2 normal, no murmur, click, rub or gallop Abdomen: soft, non-tender; bowel sounds normal; no masses,  no organomegaly Extremities: extremities normal, atraumatic, no cyanosis or edema Pulses: 2+ and symmetric Skin: Skin color, texture, turgor normal. No rashes or lesions Neurologic: Grossly normal  EKG: EKG Interpretation Date/Time:  Tuesday Nov 01 2023 09:05:12 EDT Ventricular Rate:  61 PR Interval:  194 QRS Duration:  100 QT Interval:  418 QTC Calculation: 420 R Axis:   2  Text Interpretation: Normal sinus rhythm Incomplete right bundle branch block Cannot rule out Anteroseptal infarct , age undetermined When compared with ECG of 29-Feb-2012 03:42, Vent. rate has decreased BY  32 BPM Questionable change in QRS axis Confirmed by Dinah Franco [phone]) on 11/01/2023 9:16:16 AM     ASSESSMENT: Coronary artery disease status post 5 vessel CABG in 2011 - asymptomatic Family history of coronary disease Dyslipidemia Hypertension Peripheral vascular calcification seen on x-ray  PLAN: 1.   Mr. Mccombie continues to do well now 14 years out of bypass surgery.  He denies any anginal  symptoms.  He gets relatively infrequent shortness of breath but not with exercise or exertion.  He continues to do very well.  He recently lost some weight and appears to be in a healthy way at this time.  His cholesterol was at goal in January.  Blood pressure was also at target.  Will plan to continue our current therapies.  Follow-up with me annually or sooner as necessary.  Hazle Lites, MD, [facility], FNLA, FACP  [place]  [facility]  Medical Director of the Advanced Lipid Disorders &  Cardiovascular Risk Reduction Clinic Diplomate of the American Board of Clinical Lipidology Attending Cardiologist  Direct Dial: [phone]  Fax: [phone]  Website:  [email or web address] 11/01/2023, 9:16 AM

## 2023-11-05 DIAGNOSIS — I2510 Atherosclerotic heart disease of native coronary artery without angina pectoris: Secondary | ICD-10-CM | POA: Diagnosis not present

## 2023-11-05 DIAGNOSIS — E785 Hyperlipidemia, unspecified: Secondary | ICD-10-CM | POA: Diagnosis not present

## 2023-11-05 DIAGNOSIS — I10 Essential (primary) hypertension: Secondary | ICD-10-CM | POA: Diagnosis not present

## 2023-12-02 DIAGNOSIS — H2512 Age-related nuclear cataract, left eye: Secondary | ICD-10-CM | POA: Diagnosis not present

## 2023-12-02 DIAGNOSIS — H2513 Age-related nuclear cataract, bilateral: Secondary | ICD-10-CM | POA: Diagnosis not present

## 2023-12-02 DIAGNOSIS — H25043 Posterior subcapsular polar age-related cataract, bilateral: Secondary | ICD-10-CM | POA: Diagnosis not present

## 2023-12-02 DIAGNOSIS — H25013 Cortical age-related cataract, bilateral: Secondary | ICD-10-CM | POA: Diagnosis not present

## 2023-12-02 DIAGNOSIS — H18413 Arcus senilis, bilateral: Secondary | ICD-10-CM | POA: Diagnosis not present

## 2023-12-05 DIAGNOSIS — I2510 Atherosclerotic heart disease of native coronary artery without angina pectoris: Secondary | ICD-10-CM | POA: Diagnosis not present

## 2023-12-05 DIAGNOSIS — I10 Essential (primary) hypertension: Secondary | ICD-10-CM | POA: Diagnosis not present

## 2023-12-05 DIAGNOSIS — E785 Hyperlipidemia, unspecified: Secondary | ICD-10-CM | POA: Diagnosis not present

## 2023-12-06 DIAGNOSIS — L814 Other melanin hyperpigmentation: Secondary | ICD-10-CM | POA: Diagnosis not present

## 2023-12-06 DIAGNOSIS — L821 Other seborrheic keratosis: Secondary | ICD-10-CM | POA: Diagnosis not present

## 2023-12-06 DIAGNOSIS — R202 Paresthesia of skin: Secondary | ICD-10-CM | POA: Diagnosis not present

## 2023-12-06 DIAGNOSIS — L853 Xerosis cutis: Secondary | ICD-10-CM | POA: Diagnosis not present

## 2023-12-06 DIAGNOSIS — D225 Melanocytic nevi of trunk: Secondary | ICD-10-CM | POA: Diagnosis not present

## 2023-12-06 DIAGNOSIS — L570 Actinic keratosis: Secondary | ICD-10-CM | POA: Diagnosis not present

## 2023-12-27 DIAGNOSIS — Z0000 Encounter for general adult medical examination without abnormal findings: Secondary | ICD-10-CM | POA: Diagnosis not present

## 2023-12-27 DIAGNOSIS — I10 Essential (primary) hypertension: Secondary | ICD-10-CM | POA: Diagnosis not present

## 2023-12-27 DIAGNOSIS — I2510 Atherosclerotic heart disease of native coronary artery without angina pectoris: Secondary | ICD-10-CM | POA: Diagnosis not present

## 2023-12-27 DIAGNOSIS — E785 Hyperlipidemia, unspecified: Secondary | ICD-10-CM | POA: Diagnosis not present

## 2024-01-05 DIAGNOSIS — I10 Essential (primary) hypertension: Secondary | ICD-10-CM | POA: Diagnosis not present

## 2024-01-05 DIAGNOSIS — E785 Hyperlipidemia, unspecified: Secondary | ICD-10-CM | POA: Diagnosis not present

## 2024-01-05 DIAGNOSIS — I2510 Atherosclerotic heart disease of native coronary artery without angina pectoris: Secondary | ICD-10-CM | POA: Diagnosis not present

## 2024-01-21 ENCOUNTER — Other Ambulatory Visit: Payer: Self-pay | Admitting: Internal Medicine

## 2024-02-05 DIAGNOSIS — I10 Essential (primary) hypertension: Secondary | ICD-10-CM | POA: Diagnosis not present

## 2024-02-05 DIAGNOSIS — E785 Hyperlipidemia, unspecified: Secondary | ICD-10-CM | POA: Diagnosis not present

## 2024-02-05 DIAGNOSIS — I2510 Atherosclerotic heart disease of native coronary artery without angina pectoris: Secondary | ICD-10-CM | POA: Diagnosis not present

## 2024-03-06 DIAGNOSIS — I2510 Atherosclerotic heart disease of native coronary artery without angina pectoris: Secondary | ICD-10-CM | POA: Diagnosis not present

## 2024-03-06 DIAGNOSIS — E785 Hyperlipidemia, unspecified: Secondary | ICD-10-CM | POA: Diagnosis not present

## 2024-03-06 DIAGNOSIS — I10 Essential (primary) hypertension: Secondary | ICD-10-CM | POA: Diagnosis not present

## 2024-03-21 DIAGNOSIS — L570 Actinic keratosis: Secondary | ICD-10-CM | POA: Diagnosis not present

## 2024-03-21 DIAGNOSIS — X32XXXA Exposure to sunlight, initial encounter: Secondary | ICD-10-CM | POA: Diagnosis not present

## 2024-03-21 DIAGNOSIS — L821 Other seborrheic keratosis: Secondary | ICD-10-CM | POA: Diagnosis not present

## 2024-05-28 ENCOUNTER — Telehealth: Payer: Self-pay | Admitting: Internal Medicine

## 2024-05-28 NOTE — Telephone Encounter (Signed)
 Pt c/o BP issue: STAT if pt c/o blurred vision, one-sided weakness or slurred speech.  STAT if BP is GREATER than 180/120 TODAY.  STAT if BP is LESS than 90/60 and SYMPTOMATIC TODAY  1. What is your BP concern? Pt states his bp has been high   2. Have you taken any BP medication today? Yes   3. What are your last 5 BP readings? 168/69 - this morning; 12/10 - 200/90   4. Are you having any other symptoms (ex. Dizziness, headache, blurred vision, passed out)? Palpitations

## 2024-05-28 NOTE — Telephone Encounter (Signed)
 Spoke with patient of Dr. Mona with h/o CAD, s/p CABG  He reports BP has been elevated for a few months.   Saw PCP 12/10 - PCP increased meds related to SBP 200s  - increased ramipril  to 10mg   - increased metoprolol  tartrate to 50mg  BID  BP now 140s-150s/70s at home  Last week - heart palps, skipping beats every 15 seconds for several hours. Rested >> resolved.  Last night - heart palps x4 hours  No prior h/o palps per patient report  Scheduled for OV on 12/23 @ 3:35pm with [facility] NP  Asked that he bring home BP log to appt

## 2024-05-29 ENCOUNTER — Encounter: Payer: Self-pay | Admitting: Emergency Medicine

## 2024-05-29 ENCOUNTER — Ambulatory Visit: Attending: Emergency Medicine | Admitting: Emergency Medicine

## 2024-05-29 ENCOUNTER — Ambulatory Visit: Attending: Emergency Medicine

## 2024-05-29 VITALS — BP 154/78 | HR 65 | Ht 68.0 in | Wt 183.0 lb

## 2024-05-29 DIAGNOSIS — Z951 Presence of aortocoronary bypass graft: Secondary | ICD-10-CM

## 2024-05-29 DIAGNOSIS — E785 Hyperlipidemia, unspecified: Secondary | ICD-10-CM

## 2024-05-29 DIAGNOSIS — R002 Palpitations: Secondary | ICD-10-CM

## 2024-05-29 DIAGNOSIS — I25810 Atherosclerosis of coronary artery bypass graft(s) without angina pectoris: Secondary | ICD-10-CM | POA: Diagnosis not present

## 2024-05-29 DIAGNOSIS — I10 Essential (primary) hypertension: Secondary | ICD-10-CM

## 2024-05-29 MED ORDER — CARVEDILOL 25 MG PO TABS: 25.0000 mg | ORAL_TABLET | Freq: Two times a day (BID) | ORAL | 2 refills | Status: DC

## 2024-05-29 MED ORDER — RAMIPRIL 10 MG PO CAPS: 10.0000 mg | ORAL_CAPSULE | Freq: Two times a day (BID) | ORAL | Status: DC

## 2024-05-29 MED ORDER — METOPROLOL TARTRATE 50 MG PO TABS: 50.0000 mg | ORAL_TABLET | Freq: Two times a day (BID) | ORAL | Status: DC

## 2024-05-29 NOTE — Progress Notes (Unsigned)
Enrolled patient for a 7 day Zio XT monitor to be mailed to patients home   Hilty to read

## 2024-05-29 NOTE — Progress Notes (Signed)
 " Cardiology Office Note:    Date:  05/29/2024  ID:  Patrick Stout, DOB 1951/01/18, MRN [id number] PCP: [facility]  [facility] Providers Cardiologist:  Patrick JAYSON Maxcy, MD Cardiology APP:  Patrick Lum CROME, NP       Patient Profile:       Chief Complaint: Acute visit for palpitations and hypertension History of Present Illness:  Patrick Stout is a 73 y.o. male with visit-pertinent history of CAD s/p CABG, hyperlipidemia, and hypertension  status post bypass surgery with LIMA to LAD, RIMA to the OM and SVG to D1, SVG to PDA, and sequential SVG to PDA in April of 2011. 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 Based on that, we are aggressively treating his cholesterol.  He underwent exercise Myoview  in 2016 that was without ischemia or infarction.  Last seen in clinic on 11/01/2023 by Dr. Maxcy.  He was doing well now 14 years post bypass surgery.  He was without anginal symptoms.  His cholesterol and blood pressures were at goal.  He was to follow-up in 1 year.  Patient called into nurse triage line on 05/28/2024.  Noted his blood pressure been elevated for a few months.  Saw his PCP on 12/10 and PCP increase his meds related to systolic blood pressures in the 200s.  Increase ramipril  to 10 mg daily and increase metoprolol  to tartrate to 50 mg twice daily.  Patient also was noted to have new onset palpitations.  He was made an office visit for further evaluation.   Discussed the use of AI scribe software for clinical note transcription with the patient, who gave verbal consent to proceed.  History of Present Illness Patrick Stout is a 73 year old male with hypertension and coronary artery disease who presents with uncontrolled blood pressure and palpitations.   He has hypertension that has  been more elevated for the past three to four months. After years of control near 130/70 mmHg following his five-vessel coronary bypass 14 years ago, recent office readings were high, confirmed with home monitoring averaging about 151/74 mmHg. Despite increasing ramipril  to 10 mg twice daily and metoprolol  to 50 mg daily, his blood pressure remains above goal.  Over the last two weeks he has had two episodes of palpitations, described as frequent skipped beats about every 15 seconds lasting two to three hours. The most recent episode was two days ago. Symptoms are worse when lying down and improve when sitting up. He denies dizziness, lightheadedness, syncope, chest pain, or significant shortness of breath with these episodes. He fainted once a year and a half ago in the setting of dehydration and poor oral intake.  No recent episodes of syncope or presyncope.  No lightheadedness or dizziness.  His current medications are metoprolol  50 mg daily and ramipril  10 mg twice daily. He recently tried Cialis, Viagra, and apomorphine for erectile dysfunction and stopped them because of adverse effects.  He exercises regularly and tracks his nutrition. He denies smoking and excessive caffeine intake.  He has coronary artery disease status post five-vessel coronary artery bypass 14 years ago and has been on ramipril  and metoprolol  since then. He denies chest pain and reports only occasional mild dyspnea when walking uphill.   Review of systems:  Please see the history of present illness. All other  systems are reviewed and otherwise negative.      Studies Reviewed:    EKG Interpretation Date/Time:  Tuesday May 29 2024 15:52:00 EST Ventricular Rate:  65 PR Interval:  198 QRS Duration:  102 QT Interval:  416 QTC Calculation: 432 R Axis:   14  Text Interpretation: Normal sinus rhythm Incomplete right bundle branch block When compared with ECG of 01-Nov-2023 09:05, No significant change was found  Confirmed by Patrick Dixon [phone]) on 05/29/2024 4:23:26 PM    Exercise Myoview 05/06/2015 Nuclear stress EF: 56%. The left ventricular ejection fraction is normal (55-65%). Blood pressure demonstrated a hypertensive response to exercise. There was no ST segment deviation noted during stress. The study is normal.   Normal stress nuclear study with no ischemia or infarction; EF 56 with normal wall motion.  Risk Assessment/Calculations:     HYPERTENSION CONTROL Vitals:   05/29/24 1545 05/29/24 1625  BP: (!) 146/72 (!) 154/78    The patient's blood pressure is elevated above target today.  In order to address the patient's elevated BP: A current anti-hypertensive medication was adjusted today.           Physical Exam:   VS:  BP (!) 154/78   Pulse 65   Ht 5' 8 (1.727 m)   Wt 183 lb (83 kg)   SpO2 98%   BMI 27.83 kg/m    Wt Readings from Last 3 Encounters:  05/29/24 183 lb (83 kg)  11/01/23 189 lb (85.7 kg)  08/12/22 193 lb 6.4 oz (87.7 kg)    GEN: Well nourished, well developed in no acute distress NECK: No JVD; No carotid bruits CARDIAC: RRR, no murmurs, rubs, gallops RESPIRATORY:  Clear to auscultation without rales, wheezing or rhonchi  ABDOMEN: Soft, non-tender, non-distended EXTREMITIES:  No edema; No acute deformity      Assessment and Plan:  Hypertension Blood pressure today is 154/78 Average blood pressure over the past 3 months is 150/86 - He has noted over the past 3 months to experience higher than normal blood pressure readings.  Several readings as high as 200 systolic.  Recently his PCP had increased his ramipril  and metoprolol  tartrate and he has seen no improvement - Plan to discontinue metoprolol  and start carvedilol  25 mg twice daily - Continue ramipril  10 mg twice daily  Palpitations Over the past 2 weeks he has noted 2 episodes of palpitations described as frequent skipped beats are about every 15 seconds lasting for total 2 to 3 hours.  He  denies any associated symptoms - EKG today shows normal sinus rhythm with chronic RBBB - Plan for 7-day ZIO monitor to assess for arrhythmia - We discussed triggers for PVCs/PACs   Coronary artery disease S/p CABG with LIMA to LAD, RIMA to the OM and SVG to D1, SVG to PDA, and sequential SVG to PDA in April of 2011  - Today patient is stable without chest pains or dyspnea.  He maintains a very active lifestyle and routinely going to the gym without exertional symptoms.  Denies prior anginal equivalent.  No indication further ischemic evaluation at this time - Continue aspirin  81 mg daily and rosuvastatin  40 mg daily  Hyperlipidemia LDL 56 on 12/2023 and well-controlled - Continue rosuvastatin  40 mg       Dispo:  Return in about 6 weeks (around 07/10/2024).  Signed, Dixon LITTIE Rana, NP  "

## 2024-05-29 NOTE — Patient Instructions (Addendum)
 Medication Instructions:  STOP TAKING METOPROLOL  TARTRATE.  START TAKING CARVEDILOL  25 MG TWICE DAILY.  Lab Work: NONE TO BE DONE TODAY.  Testing/Procedures: Patrick Stout- Long Term Monitor Instructions  Your physician has requested you wear a ZIO patch monitor for 7 days.  This is a single patch monitor. [employer] supplies one patch monitor per enrollment. Additional stickers are not available. Please do not apply patch if you will be having a Nuclear Stress Test,  Echocardiogram, Cardiac CT, MRI, or Chest Xray during the period you would be wearing the  monitor. The patch cannot be worn during these tests. You cannot remove and re-apply the  ZIO XT patch monitor.  Your ZIO patch monitor will be mailed 3 day USPS to your address on file. It may take 3-5 days  to receive your monitor after you have been enrolled.  Once you have received your monitor, please review the enclosed instructions. Your monitor  has already been registered assigning a specific monitor serial # to you.  Billing and Patient Assistance Program Information  We have supplied [employer] with any of your insurance information on file for billing purposes. [employer] offers a sliding scale Patient Assistance Program for patients that do not have  insurance, or whose insurance does not completely cover the cost of the ZIO monitor.  You must apply for the Patient Assistance Program to qualify for this discounted rate.  To apply, please call [employer] at [phone], select option 4, select option 2, ask to apply for  Patient Assistance Program. [name] will ask your household income, and how many people  are in your household. They will quote your out-of-pocket cost based on that information.  [employer] will also be able to set up a 76-month, interest-free payment plan if needed.  Applying the monitor   Shave hair from upper left chest.  Hold abrader disc by orange tab. Rub abrader in 40 strokes over the upper left chest as   indicated in your monitor instructions.  Clean area with 4 enclosed alcohol pads. Let dry.  Apply patch as indicated in monitor instructions. Patch will be placed under collarbone on left  side of chest with arrow pointing upward.  Rub patch adhesive wings for 2 minutes. Remove white label marked 1. Remove the white  label marked 2. Rub patch adhesive wings for 2 additional minutes.  While looking in a mirror, press and release button in center of patch. A small green light will  flash 3-4 times. This will be your only indicator that the monitor has been turned on.  Do not shower for the first 24 hours. You may shower after the first 24 hours.  Press the button if you feel a symptom. You will hear a small click. Record Date, Time and  Symptom in the Patient Logbook.  When you are ready to remove the patch, follow instructions on the last 2 pages of Patient  Logbook. Stick patch monitor onto the last page of Patient Logbook.  Place Patient Logbook in the blue and white box. Use locking tab on box and tape box closed  securely. The blue and white box has prepaid postage on it. Please place it in the mailbox as  soon as possible. Your physician should have your test results approximately 7 days after the  monitor has been mailed back to [facility].  Call [facility] Customer Care at [phone] if you have questions regarding  your ZIO XT patch monitor. Call them immediately if you see an orange light  blinking on your  monitor.  If your monitor falls off in less than 4 days, contact our Monitor department at [phone].  If your monitor becomes loose or falls off after 4 days call [employer] at [phone] for  suggestions on securing your monitor   Follow-Up: At [facility], you and your health needs are our priority.  As part of our continuing mission to provide you with exceptional heart care, our providers are all part of one team.  This team includes your  primary Cardiologist (physician) and Advanced Practice Providers or APPs (Physician Assistants and Nurse Practitioners) who all work together to provide you with the care you need, when you need it.  Your next appointment:   4-6 WEEKS  Provider:   Vinie JAYSON Maxcy, MD OR Lum Louis, NP

## 2024-06-01 ENCOUNTER — Encounter: Payer: Self-pay | Admitting: Internal Medicine

## 2024-06-02 ENCOUNTER — Emergency Department (HOSPITAL_COMMUNITY)

## 2024-06-02 ENCOUNTER — Other Ambulatory Visit: Payer: Self-pay

## 2024-06-02 ENCOUNTER — Emergency Department (HOSPITAL_COMMUNITY)
Admission: EM | Admit: 2024-06-02 | Discharge: 2024-06-02 | Disposition: A | Discharge status: Home / Self Care | Attending: Emergency Medicine | Admitting: Emergency Medicine

## 2024-06-02 ENCOUNTER — Encounter (HOSPITAL_COMMUNITY): Payer: Self-pay

## 2024-06-02 DIAGNOSIS — Z79899 Other long term (current) drug therapy: Secondary | ICD-10-CM | POA: Diagnosis not present

## 2024-06-02 DIAGNOSIS — I10 Essential (primary) hypertension: Secondary | ICD-10-CM | POA: Insufficient documentation

## 2024-06-02 DIAGNOSIS — R072 Precordial pain: Secondary | ICD-10-CM

## 2024-06-02 DIAGNOSIS — Z955 Presence of coronary angioplasty implant and graft: Secondary | ICD-10-CM | POA: Insufficient documentation

## 2024-06-02 DIAGNOSIS — R030 Elevated blood-pressure reading, without diagnosis of hypertension: Secondary | ICD-10-CM

## 2024-06-02 DIAGNOSIS — R002 Palpitations: Secondary | ICD-10-CM

## 2024-06-02 DIAGNOSIS — Z7982 Long term (current) use of aspirin: Secondary | ICD-10-CM | POA: Insufficient documentation

## 2024-06-02 DIAGNOSIS — R079 Chest pain, unspecified: Secondary | ICD-10-CM | POA: Diagnosis present

## 2024-06-02 DIAGNOSIS — I2510 Atherosclerotic heart disease of native coronary artery without angina pectoris: Secondary | ICD-10-CM | POA: Diagnosis not present

## 2024-06-02 LAB — BASIC METABOLIC PANEL WITH GFR
Anion gap: 9 (ref 5–15)
BUN: 12 mg/dL (ref 8–23)
CO2: 28 mmol/L (ref 22–32)
Calcium: 9.8 mg/dL (ref 8.9–10.3)
Chloride: 105 mmol/L (ref 98–111)
Creatinine, Ser: 1.07 mg/dL (ref 0.61–1.24)
GFR, Estimated: >60 mL/min
Glucose, Bld: 101 mg/dL — ABNORMAL HIGH (ref 70–99)
Potassium: 4.5 mmol/L (ref 3.5–5.1)
Sodium: 141 mmol/L (ref 135–145)

## 2024-06-02 LAB — CBC
HCT: 42.5 % (ref 39.0–52.0)
Hemoglobin: 14.4 g/dL (ref 13.0–17.0)
MCH: 32.4 pg (ref 26.0–34.0)
MCHC: 33.9 g/dL (ref 30.0–36.0)
MCV: 95.5 fL (ref 80.0–100.0)
Platelets: 193 K/uL (ref 150–400)
RBC: 4.45 MIL/uL (ref 4.22–5.81)
RDW: 11.9 % (ref 11.5–15.5)
WBC: 7.3 K/uL (ref 4.0–10.5)
nRBC: 0 % (ref 0.0–0.2)

## 2024-06-02 LAB — TROPONIN T, HIGH SENSITIVITY
Troponin T High Sensitivity: <15 ng/L (ref 0–19)
Troponin T High Sensitivity: <15 ng/L (ref 0–19)

## 2024-06-02 LAB — CBG MONITORING, ED: Glucose-Capillary: 101 mg/dL — ABNORMAL HIGH (ref 70–99)

## 2024-06-02 MED ORDER — FAMOTIDINE 20 MG PO TABS
20.0000 mg | ORAL_TABLET | Freq: Once | ORAL | Status: AC
  Administered 2024-06-02: 20 mg via ORAL
  Filled 2024-06-02: qty 1

## 2024-06-02 MED ORDER — ALUM & MAG HYDROXIDE-SIMETH 200-200-20 MG/5ML PO SUSP
30.0000 mL | Freq: Once | ORAL | Status: AC
  Administered 2024-06-02: 30 mL via ORAL
  Filled 2024-06-02: qty 30

## 2024-06-02 MED ORDER — ACETAMINOPHEN 500 MG PO TABS
1000.0000 mg | ORAL_TABLET | Freq: Once | ORAL | Status: AC
  Administered 2024-06-02: 1000 mg via ORAL
  Filled 2024-06-02: qty 2

## 2024-06-02 MED ORDER — HYDRALAZINE HCL 20 MG/ML IJ SOLN
10.0000 mg | Freq: Once | INTRAMUSCULAR | Status: AC
  Administered 2024-06-02: 10 mg via INTRAVENOUS
  Filled 2024-06-02: qty 1

## 2024-06-02 NOTE — ED Provider Notes (Signed)
 "  EMERGENCY DEPARTMENT AT [facility] Provider Note   CSN: [id number] Arrival date & time: 06/02/24  1108     Patient presents with: Chest Pain and Hypertension   Patrick Stout is a 73 y.o. male.   Pt with hx cad, cabg 14 yrs ago, htn, presents with concern for his blood pressure that has been high for past month. States reads have ranged from sbg 140-150 up to as high as 200. Also notes intermittent palpitations, described as if irregular or quivering sensation in chest, and chest tightness. No syncope. No exertional chest pain - indicates exercises frequently and with intensity, but no chest pain or discomfort with exertion or exercise. No constant or pleuritic chest pain. No sob. No cough or uri symptoms. No heartburn. No leg pain or swelling.  Recently saw cardiology for same concerns, was set up for outpatient zio monitor and had metoprolol  changed to carvedilol . Denies other change in meds. Denies stimulant or excessive caffeine use.  No heat intolerance, sweats, wt loss, or hx thyroid disease.   The history is provided by the patient, the spouse and medical records.  Chest Pain Associated symptoms: palpitations   Associated symptoms: no abdominal pain, no back pain, no cough, no fever, no headache, no nausea, no shortness of breath and no vomiting   Hypertension Associated symptoms include chest pain. Pertinent negatives include no abdominal pain, no headaches and no shortness of breath.       Prior to Admission medications  Medication Sig Start Date End Date Taking? Authorizing Provider  aspirin  81 MG chewable tablet Chew 1 tablet (81 mg total) by mouth daily. 08/08/18   Hilty, Vinie BROCKS, MD  carvedilol  (COREG ) 25 MG tablet Take 1 tablet (25 mg total) by mouth 2 (two) times daily with a meal. 05/29/24   Fountain, Madison L, NP  EVENING PRIMROSE OIL PO Take 1 tablet by mouth 2 (two) times daily.     [provider]  Flaxseed, Linseed, (FLAXSEED OIL)  1000 MG CAPS Take 2 capsules by mouth 2 (two) times daily.     [provider]  folic acid  (FOLVITE ) 1 MG tablet TAKE 1 TABLET BY MOUTH EVERY DAY 01/23/24   Hilty, Vinie BROCKS, MD  Multiple Vitamin (MULTIVITAMIN WITH MINERALS) TABS Take 1 tablet by mouth daily.    [provider]  Omega-3 Fatty Acids (FISH OIL) 1000 MG CAPS Take 1,000 mg by mouth in the morning and at bedtime.    [provider]  ramipril  (ALTACE ) 10 MG capsule Take 1 capsule (10 mg total) by mouth 2 (two) times daily. 05/29/24   Rana Lum CROME, NP  rosuvastatin  (CRESTOR ) 40 MG tablet Take 1 tablet (40 mg total) by mouth daily. 08/08/18   Hilty, Vinie BROCKS, MD    Allergies: Patient has no known allergies.    Review of Systems  Constitutional:  Negative for chills and fever.  HENT:  Negative for sore throat.   Respiratory:  Negative for cough and shortness of breath.   Cardiovascular:  Positive for chest pain and palpitations. Negative for leg swelling.  Gastrointestinal:  Negative for abdominal pain, nausea and vomiting.  Genitourinary:  Negative for flank pain.  Musculoskeletal:  Negative for back pain and neck pain.  Neurological:  Negative for syncope and headaches.    Updated Vital Signs BP (!) 178/87   Pulse (!) 51   Temp 97.6 F (36.4 C)   Resp 15   SpO2 100%   Physical Exam  Vitals and nursing note reviewed.  Constitutional:      Appearance: Normal appearance. He is well-developed.  HENT:     Head: Atraumatic.     Nose: Nose normal.     Mouth/Throat:     Mouth: Mucous membranes are moist.     Pharynx: Oropharynx is clear.  Eyes:     General: No scleral icterus.    Conjunctiva/sclera: Conjunctivae normal.  Neck:     Trachea: No tracheal deviation.     Comments: Trachea midline, thyroid not grossly enlarged or  tender.  Cardiovascular:     Rate and Rhythm: Normal rate and regular rhythm.     Pulses: Normal pulses.     Heart sounds: Normal heart sounds. No murmur heard.     No friction rub. No gallop.  Pulmonary:     Effort: Pulmonary effort is normal. No accessory muscle usage or respiratory distress.     Breath sounds: Normal breath sounds.  Abdominal:     General: Bowel sounds are normal. There is no distension.     Palpations: Abdomen is soft.     Tenderness: There is no abdominal tenderness.     Comments: No bruits.   Musculoskeletal:        General: No swelling or tenderness.     Cervical back: Normal range of motion and neck supple. No rigidity.     Right lower leg: No edema.     Left lower leg: No edema.  Skin:    General: Skin is warm and dry.     Findings: No rash.  Neurological:     Mental Status: He is alert.     Comments: Alert, speech clear.   Psychiatric:        Mood and Affect: Mood normal.     (all labs ordered are listed, but only abnormal results are displayed) Results for orders placed or performed during the hospital encounter of 06/02/24  Basic metabolic panel   Collection Time: 06/02/24 12:17 PM  Result Value Ref Range   Sodium 141 135 - 145 mmol/L   Potassium 4.5 3.5 - 5.1 mmol/L   Chloride 105 98 - 111 mmol/L   CO2 28 22 - 32 mmol/L   Glucose, Bld 101 (H) 70 - 99 mg/dL   BUN 12 8 - 23 mg/dL   Creatinine, Ser 8.92 0.61 - 1.24 mg/dL   Calcium  9.8 8.9 - 10.3 mg/dL   GFR, Estimated >39 >39 mL/min   Anion gap 9 5 - 15  CBC   Collection Time: 06/02/24 12:17 PM  Result Value Ref Range   WBC 7.3 4.0 - 10.5 K/uL   RBC 4.45 4.22 - 5.81 MIL/uL   Hemoglobin 14.4 13.0 - 17.0 g/dL   HCT 57.4 60.9 - 47.9 %   MCV 95.5 80.0 - 100.0 fL   MCH 32.4 26.0 - 34.0 pg   MCHC 33.9 30.0 - 36.0 g/dL   RDW 88.0 88.4 - 84.4 %   Platelets 193 150 - 400 K/uL   nRBC 0.0 0.0 - 0.2 %  Troponin T, High Sensitivity   Collection Time: 06/02/24 12:17 PM  Result Value Ref Range   Troponin T High Sensitivity <15 0 - 19 ng/L  Troponin T, High Sensitivity   Collection Time: 06/02/24  2:17 PM  Result Value Ref Range   Troponin T High Sensitivity  <15 0 - 19 ng/L  POC CBG, ED   Collection Time: 06/02/24  3:25 PM  Result Value Ref Range  Glucose-Capillary 101 (H) 70 - 99 mg/dL   DG Chest 2 View Result Date: 06/02/2024 CLINICAL DATA:  Left-sided chest tightness. EXAM: CHEST - 2 VIEW COMPARISON:  11/10/2009 FINDINGS: The lungs are clear without focal pneumonia, edema, pneumothorax or pleural effusion. Cardiopericardial silhouette is at upper limits of normal for size. No acute bony abnormality. IMPRESSION: No active cardiopulmonary disease. Electronically Signed   By: Camellia Candle M.D.   On: 06/02/2024 13:51     EKG: EKG Interpretation Date/Time:  Saturday June 02 2024 15:14:59 EST Ventricular Rate:  59 PR Interval:  225 QRS Duration:  106 QT Interval:  451 QTC Calculation: 447 R Axis:   77  Text Interpretation: Sinus rhythm Nonspecific ST abnormality No significant change since last tracing Confirmed by Bernard Drivers ([id number]) on 06/02/2024 3:35:18 PM  Radiology: DG Chest 2 View Result Date: 06/02/2024 CLINICAL DATA:  Left-sided chest tightness. EXAM: CHEST - 2 VIEW COMPARISON:  11/10/2009 FINDINGS: The lungs are clear without focal pneumonia, edema, pneumothorax or pleural effusion. Cardiopericardial silhouette is at upper limits of normal for size. No acute bony abnormality. IMPRESSION: No active cardiopulmonary disease. Electronically Signed   By: Camellia Candle M.D.   On: 06/02/2024 13:51     Procedures   Medications Ordered in the ED  acetaminophen  (TYLENOL ) tablet 1,000 mg (1,000 mg Oral Given 06/02/24 1555)  alum & mag hydroxide-simeth (MAALOX/MYLANTA) 200-200-20 MG/5ML suspension 30 mL (30 mLs Oral Given 06/02/24 1556)  famotidine  (PEPCID ) tablet 20 mg (20 mg Oral Given 06/02/24 1555)  hydrALAZINE  (APRESOLINE ) injection 10 mg (10 mg Intravenous Given 06/02/24 1624)                                    Medical Decision Making Problems Addressed: Elevated blood pressure reading: acute illness or  injury Essential hypertension: chronic illness or injury with exacerbation, progression, or side effects of treatment that poses a threat to life or bodily functions Palpitations: acute illness or injury with systemic symptoms Precordial chest pain: acute illness or injury with systemic symptoms that poses a threat to life or bodily functions  Amount and/or Complexity of Data Reviewed Independent Historian: spouse    Details: hx External Data Reviewed: notes. Labs: ordered. Decision-making details documented in ED Course. Radiology: ordered and independent interpretation performed. Decision-making details documented in ED Course. ECG/medicine tests: ordered and independent interpretation performed. Decision-making details documented in ED Course.  Risk OTC drugs. Prescription drug management. Decision regarding hospitalization.   Iv ns. Continuous pulse ox and cardiac monitoring. Labs ordered/sent. Imaging ordered.   Differential diagnosis includes palpitations, pvcs, pacs, other dysrhythmia, acs, htn,etc. Dispo decision including potential need for admission considered - will get labs and imaging and reassess.   Reviewed nursing notes and prior charts for additional history. External reports reviewed. Additional history from: spouse.   Cardiac monitor: sinus rhythm, rate 55.   Hydralazine  dose iv.   Labs reviewed/interpreted by me - wbc and hgb normal. Chem normal. Trop x  2 normal and not increasing - felt not c/w acs.   Xrays reviewed/interpreted by me - no pna.   Recheck bp improved. No cp or sob or increased wob. In nsr. Currently hr 54, rr 14, pulse ox 100%.  Pt currently appears stable for ed d/c.   Rec close cardiology f/u, referral provided.   Return precautions provided.       Final diagnoses:  Elevated blood pressure reading  Essential hypertension  Precordial chest pain  Palpitations    ED Discharge Orders          Ordered    Ambulatory referral to  Cardiology       Comments: If you have not heard from the Cardiology office within the next 72 hours please call [phone].   06/02/24 1843               Bernard Drivers, MD 06/02/24 1845  "

## 2024-06-02 NOTE — Discharge Instructions (Signed)
 It was our pleasure to provide your ER care today - we hope that you feel better.  For recent chest pain, palpitations, and high blood pressure, follow up closely with cardiologist in the coming week - we made referral, and they should be contacting you with an appointment in the next few days.   Continue your meds, limit salt intake, heart healthy eating plan.   Return to ER right away if worse, new symptoms, fevers, recurrent/persistent chest pain, increased trouble breathing, persistent fast heart beating, fainting, or other concern.

## 2024-06-02 NOTE — ED Provider Triage Note (Signed)
 Emergency Medicine Provider Triage Evaluation Note  Patrick Stout , a 73 y.o. male  was evaluated in triage.  Pt complains of hypertension, left-sided chest tightness.  Patient is followed closely by cardiology, history of CABG x 5, blood pressure medications have recently been adjusted and patient is back on his metoprolol  due to having issues with bradycardia while on carvedilol .  Patient was found to have an elevated BP last night with systolic readings in the low 200s, this morning he noted some left-sided chest tightness and shortness of breath on exertion and a 15-second jittery sensation that concerned him.  Review of Systems  Positive: As above Negative: As above  Physical Exam  BP (!) 180/88   Pulse (!) 58   Temp 97.8 F (36.6 C) (Oral)   Resp 16   SpO2 100%  Gen:   Awake, no distress   Resp:  Normal effort  MSK:   Moves extremities without difficulty  Other:    Medical Decision Making  Medically screening exam initiated at 12:16 PM.  Appropriate orders placed.  Patrick Stout was informed that the remainder of the evaluation will be completed by another provider, this initial triage assessment does not replace that evaluation, and the importance of remaining in the ED until their evaluation is complete.     Patrick Stout SAILOR, [place] 06/02/24 1217

## 2024-06-02 NOTE — ED Notes (Signed)
 Pts spouse ran to sort desk stating pt was having an acute episode of chest tightness, RN to assess pt: pt seen clinching his chest, tearful. Vitals getting rechecked and charge RN called to get next available bed

## 2024-06-02 NOTE — ED Triage Notes (Signed)
 Pt states has been seeing dr Mona for htn; states last night bp 206/87; recent switch from metoprolol  to carvedilol  25 mg; pt reports took last night, HR dropped to low 50s, did not change BP; took meds this am, still hypertensive; also reports L chest discomfort and states he briefly became quivery while sittin at desk; still c/o chest tightness  Pt gives verbal consent for mse

## 2024-06-03 ENCOUNTER — Encounter: Payer: Self-pay | Admitting: Internal Medicine

## 2024-06-04 ENCOUNTER — Inpatient Hospital Stay (HOSPITAL_COMMUNITY)
Admission: EM | Admit: 2024-06-04 | Discharge: 2024-06-07 | DRG: 287 | Disposition: A | Discharge status: Home / Self Care | Attending: Family Medicine | Admitting: Family Medicine

## 2024-06-04 ENCOUNTER — Other Ambulatory Visit: Payer: Self-pay

## 2024-06-04 ENCOUNTER — Encounter (HOSPITAL_COMMUNITY): Payer: Self-pay | Admitting: Family Medicine

## 2024-06-04 ENCOUNTER — Observation Stay (HOSPITAL_COMMUNITY)

## 2024-06-04 ENCOUNTER — Emergency Department (HOSPITAL_COMMUNITY)

## 2024-06-04 ENCOUNTER — Telehealth: Payer: Self-pay | Admitting: Student

## 2024-06-04 DIAGNOSIS — I361 Nonrheumatic tricuspid (valve) insufficiency: Secondary | ICD-10-CM

## 2024-06-04 DIAGNOSIS — R0609 Other forms of dyspnea: Secondary | ICD-10-CM | POA: Diagnosis not present

## 2024-06-04 DIAGNOSIS — Z79899 Other long term (current) drug therapy: Secondary | ICD-10-CM

## 2024-06-04 DIAGNOSIS — I10 Essential (primary) hypertension: Secondary | ICD-10-CM | POA: Diagnosis present

## 2024-06-04 DIAGNOSIS — I200 Unstable angina: Secondary | ICD-10-CM | POA: Diagnosis not present

## 2024-06-04 DIAGNOSIS — E7800 Pure hypercholesterolemia, unspecified: Secondary | ICD-10-CM | POA: Diagnosis present

## 2024-06-04 DIAGNOSIS — E785 Hyperlipidemia, unspecified: Secondary | ICD-10-CM | POA: Diagnosis not present

## 2024-06-04 DIAGNOSIS — Z825 Family history of asthma and other chronic lower respiratory diseases: Secondary | ICD-10-CM

## 2024-06-04 DIAGNOSIS — I2583 Coronary atherosclerosis due to lipid rich plaque: Secondary | ICD-10-CM | POA: Diagnosis present

## 2024-06-04 DIAGNOSIS — R079 Chest pain, unspecified: Secondary | ICD-10-CM | POA: Diagnosis not present

## 2024-06-04 DIAGNOSIS — R001 Bradycardia, unspecified: Secondary | ICD-10-CM | POA: Diagnosis present

## 2024-06-04 DIAGNOSIS — Z951 Presence of aortocoronary bypass graft: Secondary | ICD-10-CM

## 2024-06-04 DIAGNOSIS — I2510 Atherosclerotic heart disease of native coronary artery without angina pectoris: Secondary | ICD-10-CM

## 2024-06-04 DIAGNOSIS — Z8249 Family history of ischemic heart disease and other diseases of the circulatory system: Secondary | ICD-10-CM

## 2024-06-04 DIAGNOSIS — I252 Old myocardial infarction: Secondary | ICD-10-CM

## 2024-06-04 DIAGNOSIS — I25810 Atherosclerosis of coronary artery bypass graft(s) without angina pectoris: Secondary | ICD-10-CM | POA: Diagnosis present

## 2024-06-04 DIAGNOSIS — R002 Palpitations: Secondary | ICD-10-CM | POA: Diagnosis present

## 2024-06-04 DIAGNOSIS — I160 Hypertensive urgency: Secondary | ICD-10-CM | POA: Diagnosis present

## 2024-06-04 DIAGNOSIS — I77810 Thoracic aortic ectasia: Secondary | ICD-10-CM | POA: Diagnosis present

## 2024-06-04 DIAGNOSIS — I25118 Atherosclerotic heart disease of native coronary artery with other forms of angina pectoris: Principal | ICD-10-CM | POA: Diagnosis present

## 2024-06-04 DIAGNOSIS — E860 Dehydration: Secondary | ICD-10-CM | POA: Diagnosis present

## 2024-06-04 DIAGNOSIS — Z800 Family history of malignant neoplasm of digestive organs: Secondary | ICD-10-CM

## 2024-06-04 DIAGNOSIS — Z7982 Long term (current) use of aspirin: Secondary | ICD-10-CM

## 2024-06-04 LAB — CBC
HCT: 41.9 % (ref 39.0–52.0)
Hemoglobin: 14.4 g/dL (ref 13.0–17.0)
MCH: 32.7 pg (ref 26.0–34.0)
MCHC: 34.4 g/dL (ref 30.0–36.0)
MCV: 95.2 fL (ref 80.0–100.0)
Platelets: 192 K/uL (ref 150–400)
RBC: 4.4 MIL/uL (ref 4.22–5.81)
RDW: 11.9 % (ref 11.5–15.5)
WBC: 7 K/uL (ref 4.0–10.5)
nRBC: 0 % (ref 0.0–0.2)

## 2024-06-04 LAB — BASIC METABOLIC PANEL WITH GFR
Anion gap: 10 (ref 5–15)
BUN: 20 mg/dL (ref 8–23)
CO2: 28 mmol/L (ref 22–32)
Calcium: 9.6 mg/dL (ref 8.9–10.3)
Chloride: 102 mmol/L (ref 98–111)
Creatinine, Ser: 1.12 mg/dL (ref 0.61–1.24)
GFR, Estimated: >60 mL/min
Glucose, Bld: 108 mg/dL — ABNORMAL HIGH (ref 70–99)
Potassium: 4.3 mmol/L (ref 3.5–5.1)
Sodium: 140 mmol/L (ref 135–145)

## 2024-06-04 LAB — CREATININE, SERUM
Creatinine, Ser: 1.1 mg/dL (ref 0.61–1.24)
GFR, Estimated: >60 mL/min

## 2024-06-04 LAB — ECHOCARDIOGRAM COMPLETE
Area-P 1/2: 2.07 cm2
S' Lateral: 3.2 cm

## 2024-06-04 LAB — TROPONIN T, HIGH SENSITIVITY
Troponin T High Sensitivity: 15 ng/L (ref 0–19)
Troponin T High Sensitivity: <15 ng/L (ref 0–19)
Troponin T High Sensitivity: <15 ng/L (ref 0–19)

## 2024-06-04 MED ORDER — ACETAMINOPHEN 325 MG PO TABS
650.0000 mg | ORAL_TABLET | ORAL | Status: DC | PRN
  Administered 2024-06-06 – 2024-06-07 (×3): 650 mg via ORAL
  Filled 2024-06-04 (×3): qty 2

## 2024-06-04 MED ORDER — HEPARIN SODIUM (PORCINE) 5000 UNIT/ML IJ SOLN
5000.0000 [IU] | Freq: Three times a day (TID) | INTRAMUSCULAR | Status: DC
  Administered 2024-06-04: 5000 [IU] via SUBCUTANEOUS
  Filled 2024-06-04 (×2): qty 1

## 2024-06-04 MED ORDER — METOPROLOL SUCCINATE ER 25 MG PO TB24
50.0000 mg | ORAL_TABLET | Freq: Every day | ORAL | Status: DC
  Filled 2024-06-04: qty 2

## 2024-06-04 MED ORDER — ASPIRIN 81 MG PO CHEW
81.0000 mg | CHEWABLE_TABLET | Freq: Every day | ORAL | Status: DC
  Administered 2024-06-05 – 2024-06-07 (×3): 81 mg via ORAL
  Filled 2024-06-04 (×3): qty 1

## 2024-06-04 MED ORDER — CARVEDILOL 12.5 MG PO TABS
12.5000 mg | ORAL_TABLET | Freq: Two times a day (BID) | ORAL | Status: DC
  Administered 2024-06-04 – 2024-06-07 (×6): 12.5 mg via ORAL
  Filled 2024-06-04 (×7): qty 1

## 2024-06-04 MED ORDER — NITROGLYCERIN 0.4 MG SL SUBL: 0.4000 mg | SUBLINGUAL_TABLET | SUBLINGUAL | Status: DC | PRN

## 2024-06-04 MED ORDER — ALUM & MAG HYDROXIDE-SIMETH 200-200-20 MG/5ML PO SUSP
30.0000 mL | Freq: Once | ORAL | Status: AC
  Administered 2024-06-04: 30 mL via ORAL
  Filled 2024-06-04: qty 30

## 2024-06-04 MED ORDER — SODIUM CHLORIDE 0.9 % IV SOLN: INTRAVENOUS | Status: DC

## 2024-06-04 MED ORDER — PERFLUTREN LIPID MICROSPHERE
1.0000 mL | INTRAVENOUS | Status: AC | PRN
  Administered 2024-06-04: 3 mL via INTRAVENOUS

## 2024-06-04 MED ORDER — NITROGLYCERIN 0.4 MG SL SUBL
0.4000 mg | SUBLINGUAL_TABLET | SUBLINGUAL | Status: AC
  Administered 2024-06-04: 0.4 mg via SUBLINGUAL
  Filled 2024-06-04: qty 1

## 2024-06-04 MED ORDER — ADULT MULTIVITAMIN W/MINERALS CH
1.0000 | ORAL_TABLET | Freq: Every day | ORAL | Status: DC
  Administered 2024-06-04 – 2024-06-07 (×4): 1 via ORAL
  Filled 2024-06-04 (×4): qty 1

## 2024-06-04 MED ORDER — PANTOPRAZOLE SODIUM 40 MG PO TBEC
40.0000 mg | DELAYED_RELEASE_TABLET | Freq: Two times a day (BID) | ORAL | Status: DC
  Administered 2024-06-04 – 2024-06-07 (×7): 40 mg via ORAL
  Filled 2024-06-04 (×7): qty 1

## 2024-06-04 MED ORDER — MORPHINE SULFATE (PF) 2 MG/ML IV SOLN
2.0000 mg | INTRAVENOUS | Status: DC | PRN
  Administered 2024-06-07: 2 mg via INTRAVENOUS
  Filled 2024-06-04: qty 1

## 2024-06-04 MED ORDER — ONDANSETRON HCL 4 MG/2ML IJ SOLN
4.0000 mg | Freq: Once | INTRAMUSCULAR | Status: AC
  Administered 2024-06-04: 4 mg via INTRAVENOUS
  Filled 2024-06-04: qty 2

## 2024-06-04 MED ORDER — ONDANSETRON 4 MG PO TBDP: 4.0000 mg | ORAL_TABLET | Freq: Three times a day (TID) | ORAL | Status: DC | PRN

## 2024-06-04 MED ORDER — MORPHINE SULFATE (PF) 4 MG/ML IV SOLN
4.0000 mg | Freq: Once | INTRAVENOUS | Status: AC
  Administered 2024-06-04: 4 mg via INTRAVENOUS
  Filled 2024-06-04: qty 1

## 2024-06-04 MED ORDER — AMLODIPINE BESYLATE 2.5 MG PO TABS
2.5000 mg | ORAL_TABLET | Freq: Two times a day (BID) | ORAL | Status: DC
  Administered 2024-06-04 – 2024-06-06 (×4): 2.5 mg via ORAL
  Filled 2024-06-04 (×4): qty 1

## 2024-06-04 MED ORDER — ALPRAZOLAM 0.25 MG PO TABS: 0.2500 mg | ORAL_TABLET | Freq: Two times a day (BID) | ORAL | Status: DC | PRN

## 2024-06-04 MED ORDER — ALPRAZOLAM 0.5 MG PO TABS
0.5000 mg | ORAL_TABLET | Freq: Two times a day (BID) | ORAL | Status: DC | PRN
  Administered 2024-06-06: 0.5 mg via ORAL
  Filled 2024-06-04: qty 1

## 2024-06-04 MED ORDER — ONDANSETRON HCL 4 MG/2ML IJ SOLN: 4.0000 mg | Freq: Four times a day (QID) | INTRAMUSCULAR | Status: DC | PRN

## 2024-06-04 MED ORDER — RAMIPRIL 5 MG PO CAPS
10.0000 mg | ORAL_CAPSULE | Freq: Two times a day (BID) | ORAL | Status: DC
  Administered 2024-06-04 – 2024-06-07 (×6): 10 mg via ORAL
  Filled 2024-06-04 (×8): qty 2

## 2024-06-04 MED ORDER — NITROGLYCERIN 0.4 MG SL SUBL
0.4000 mg | SUBLINGUAL_TABLET | Freq: Once | SUBLINGUAL | Status: AC
  Administered 2024-06-04: 0.4 mg via SUBLINGUAL
  Filled 2024-06-04: qty 1

## 2024-06-04 MED ORDER — DICYCLOMINE HCL 10 MG PO CAPS
10.0000 mg | ORAL_CAPSULE | Freq: Once | ORAL | Status: AC
  Administered 2024-06-04: 10 mg via ORAL
  Filled 2024-06-04: qty 1

## 2024-06-04 MED ORDER — ROSUVASTATIN CALCIUM 20 MG PO TABS
40.0000 mg | ORAL_TABLET | Freq: Every day | ORAL | Status: DC
  Administered 2024-06-04 – 2024-06-07 (×4): 40 mg via ORAL
  Filled 2024-06-04 (×4): qty 2

## 2024-06-04 MED ORDER — ASPIRIN 81 MG PO CHEW
324.0000 mg | CHEWABLE_TABLET | Freq: Once | ORAL | Status: AC
  Administered 2024-06-04: 324 mg via ORAL
  Filled 2024-06-04: qty 4

## 2024-06-04 NOTE — Assessment & Plan Note (Signed)
 Extensive history of coronary artery disease, with CABG x 5 in April 2011 -Currently presenting with chest -Continue home medication of aspirin , statins, beta-blocker and ACE inhibitor -Cardiologist consulted, appreciate further evaluation recommendations

## 2024-06-04 NOTE — Assessment & Plan Note (Signed)
 BP, currently stable, continue ramipril , metoprolol , -As needed IV hydralazine 

## 2024-06-04 NOTE — H&P (Signed)
 " History and Physical   Patient: Patrick Stout                            PCP: [facility]                    DOB: 15-Apr-1951            DOA: 06/04/2024 [id number]             DOS: 06/04/2024, 2:27 PM  [facility]  Patient coming from:   HOME  I have personally reviewed patient's medical records, in electronic medical records, including:  [facility], and care everywhere.    Chief Complaint:   Chief Complaint  Patient presents with   Chest Pain /Palpitations /HTN    History of present illness:    Patrick Stout is a 73 yo Male with history of CAD, S/p CABG X5 (09/2009),  HTN, HLD.SABRASABRAPresenting with a onset of palpitation, chest pain.  He has been having substernal chest pain and palpitation for over a month now, now it has progressively has gotten worse.  Chest pain is becoming more constant, on the left side.  Pressure type pain worse with exertion but no radiation. Pain intermittently gets worse.  Not associate with shortness of breath or diaphoresis. Patient was seen in ED on 12/27 for same complaint workup was negative patient was discharged recommended follow-up with cardiology for possible Zio patch monitoring.    ED Evaluation: Blood pressure (!) 147/91, pulse 62, temperature 98.2 F (36.8 C), temperature source Oral, resp. rate 14, SpO2 100%. LABs: CBC CMP reviewed within normal limits, troponin < 15 x 3, EKG: EKG sinus bradycardia with a rate of 59, negative for any ST elevation or depression QTc 435  EDP Dr. Jakie discussed the case with cardiology-wish to bring the patient in for further evaluation and cardiac workup.    Patient Denies having: Fever, Chills, Cough, SOB, Abd pain, N/V/D, headache, dizziness, lightheadedness,  Dysuria, Joint pain, rash, open wounds    Review of Systems: As per HPI, otherwise 10 point review of systems were negative.    ----------------------------------------------------------------------------------------------------------------------  Allergies[1]  Home MEDs:  Prior to Admission medications  Medication Sig Start Date End Date Taking? Authorizing Provider  aspirin  81 MG chewable tablet Chew 1 tablet (81 mg total) by mouth daily. 08/08/18   Hilty, Vinie BROCKS, MD  carvedilol  (COREG ) 25 MG tablet Take 1 tablet (25 mg total) by mouth 2 (two) times daily with a meal. 05/29/24   Fountain, Madison L, NP  EVENING PRIMROSE OIL PO Take 1 tablet by mouth 2 (two) times daily.     [provider]  Flaxseed, Linseed, (FLAXSEED OIL) 1000 MG CAPS Take 2 capsules by mouth 2 (two) times daily.     [provider]  folic acid  (FOLVITE ) 1 MG tablet TAKE 1 TABLET BY MOUTH EVERY DAY 01/23/24   Hilty, Vinie BROCKS, MD  metoprolol  succinate (TOPROL -XL) 50 MG 24 hr tablet 1 tablet Orally twice a day; Duration: 90 days    [provider]  Multiple Vitamin (MULTIVITAMIN WITH MINERALS) TABS Take 1 tablet by mouth daily.    [provider]  Omega-3 Fatty Acids (FISH OIL) 1000 MG CAPS Take 1,000 mg by mouth in the morning and at bedtime.    [provider]  ramipril  (ALTACE ) 10 MG capsule Take 1 capsule (10 mg total)  by mouth 2 (two) times daily. 05/29/24   Rana Lum CROME, NP  rosuvastatin  (CRESTOR ) 40 MG tablet Take 1 tablet (40 mg total) by mouth daily. 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 Dusty)   KNEE SURGERY  2008   TRANSTHORACIC ECHOCARDIOGRAM  10/04/2009   EF 55-60%     reports that he has never smoked. He has never used smokeless tobacco. He reports that he does not currently use alcohol. He reports that he does not use drugs.   Family History  Problem Relation Age of Onset   Heart attack Father    Coronary artery disease Father    Esophageal cancer Sister    Hypertension Sister    COPD Sister     Physical Exam:   Vitals:   06/04/24 1215 06/04/24 1300 06/04/24 1315 06/04/24 1416  BP: (!) 147/91     Pulse: 60 (!) 58 62   Resp: 16 15 14    Temp:    98.2 F (36.8 C)  TempSrc:    Oral  SpO2: 100% 99% 100%    Constitutional: NAD, calm, comfortable Eyes: PERRL, lids and conjunctivae normal ENMT: Mucous membranes are moist. Posterior pharynx clear of any exudate or lesions.Normal dentition.  Neck: normal, supple, no masses, no thyromegaly Respiratory: clear to auscultation bilaterally, no wheezing, no crackles. Normal respiratory effort. No accessory muscle use.  Cardiovascular: Regular rate and rhythm, no murmurs / rubs / gallops. No extremity edema. 2+ pedal pulses. No carotid bruits.  Abdomen: no tenderness, no masses palpated. No hepatosplenomegaly. Bowel sounds positive.  Musculoskeletal: no clubbing / cyanosis. No joint deformity upper and lower extremities. Good ROM, no contractures. Normal muscle tone.  Neurologic: CN II-XII grossly intact. Sensation intact, DTR normal. Strength 5/5 in all 4.  Psychiatric: Normal judgment and insight. Alert and oriented x 3. Normal mood.  Skin: no rashes, lesions, ulcers. No induration      Labs on admission:    I have personally reviewed following labs and imaging studies  CBC: Recent Labs  Lab 06/02/24 1217 06/04/24 0235  WBC 7.3 7.0  HGB 14.4 14.4  HCT 42.5 41.9  MCV 95.5 95.2   PLT 193 192   Basic Metabolic Panel: Recent Labs  Lab 06/02/24 1217 06/04/24 0235  NA 141 140  K 4.5 4.3  CL 105 102  CO2 28 28  GLUCOSE 101* 108*  BUN 12 20  CREATININE 1.07 1.12  CALCIUM  9.8 9.6    CBG: Recent Labs  Lab 06/02/24 1525  GLUCAP 101*   Urine analysis:    Component Value Date/Time   COLORURINE YELLOW 08/06/2016 0630   APPEARANCEUR CLEAR 08/06/2016 0630   LABSPEC 1.015 08/06/2016 0630   PHURINE 5.0 08/06/2016 0630   GLUCOSEU NEGATIVE 08/06/2016 0630   HGBUR MODERATE (A) 08/06/2016 0630   BILIRUBINUR NEGATIVE 08/06/2016  0630   KETONESUR NEGATIVE 08/06/2016 0630   PROTEINUR NEGATIVE 08/06/2016 0630   NITRITE NEGATIVE 08/06/2016 0630   LEUKOCYTESUR NEGATIVE 08/06/2016 0630    Last A1C:  Lab Results  Component Value Date   HGBA1C (H) 10/01/2009    5.9 (NOTE)                                                                       According to the ADA Clinical Practice Recommendations for 2011, when HbA1c is used as a screening test:   >=6.5%   Diagnostic of Diabetes Mellitus           (if abnormal result  is confirmed)  5.7-6.4%   Increased risk of developing Diabetes Mellitus  References:Diagnosis and Classification of Diabetes Mellitus,Diabetes Care,2011,34(Suppl 1):S62-S69 and Standards of Medical Care in         Diabetes - 2011,Diabetes Care,2011,34  (Suppl 1):S11-S61.     Radiologic Exams on Admission:   DG Chest 2 View Result Date: 06/04/2024 EXAM: PA AND LATERAL (2) VIEW(S) XRAY OF THE CHEST 06/04/2024 02:53:00 AM COMPARISON: Recent PA and Lateral 06/02/2024. CLINICAL HISTORY: chest pain FINDINGS: LUNGS AND PLEURA: The lungs are slightly emphysematous. No focal pulmonary opacity. No pleural effusion. No pneumothorax. HEART AND MEDIASTINUM: The heart is slightly enlarged. Sternotomy and CABG changes. 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 BONES AND SOFT TISSUES: Osteopenia and thoracic spondylosis. IMPRESSION: 1. No acute  findings. 2. Stable COPD chest. Electronically signed by: Francis Quam MD 06/04/2024 03:00 AM EST RP Workstation: [id number]    EKG:   Independently reviewed.  Orders placed or performed during the hospital encounter of 06/04/24   ED EKG   ED EKG   ED EKG   ED EKG   EKG   EKG   ED EKG   ED EKG   EKG 12-Lead (at 6am)   ---------------------------------------------------------------------------------------------------------------------------------------    Assessment / Plan:   Principal Problem:   Chest pain Active Problems:   Coronary artery disease involving coronary bypass graft of native heart without angina pectoris   Dyslipidemia   Hypertension   Assessment and Plan: * Chest pain - Admitted to telemetry floor under close observation -Cardiogram x 3 sets < 15 - EKG reviewed, nonspecific changes -Urine nitroglycerin  -As needed analgesia, morphine  -Continue aspirin , statins, beta-blockers -Cardiology consulted, appreciate further evaluation recommendation - NPO after midnight - As needed GI cocktail, Maalox, Protonix   Coronary artery disease involving coronary bypass graft of native heart without angina pectoris Extensive history of coronary artery disease, with CABG x 5 in April 2011 -Currently presenting with chest -Continue home medication of aspirin , statins, beta-blocker and ACE inhibitor -Cardiologist consulted, appreciate further evaluation recommendations  Hypertension BP, currently stable, continue ramipril , metoprolol , -As needed IV hydralazine   Dyslipidemia Continue statins     Consults called: Cardiology -------------------------------------------------------------------------------------------------------------------------------------------- DVT prophylaxis:  heparin  injection 5,000 Units Start: 06/04/24 1430 SCDs Start: 06/04/24 1420   Code Status:   Code Status: Full Code   Admission status: Patient will be admitted as  Observation, with a greater than 2 midnight length of stay. Level of care: Telemetry   Family Communication:  none at bedside  (The above findings and plan of care has been discussed with patient in detail, the patient  expressed understanding and agreement of above plan)  --------------------------------------------------------------------------------------------------------------------------------------------------  Disposition Plan:  Anticipated 1-2 days Status is: Observation The patient remains OBS appropriate and will d/c before 2 midnights.     ----------------------------------------------------------------------------------------------------------------------------------------------------  Time spent:  32  Min.  Was spent seeing and evaluating the patient, reviewing all medical records, drawn plan of care.  SIGNED: Adriana DELENA Grams, MD, FHM. FAAFP. [place] [facility], Pager  (Please use [email or web address] to page/ or secure chat through epic) If 7PM-7AM, please contact night-coverage [email or web address],  06/04/2024, 2:27 PM     [1] No Known Allergies  "

## 2024-06-04 NOTE — ED Triage Notes (Signed)
 Patient reports persistent hypertension despite medications dosage adjustment for several weeks , left chest pain and palpitations with SOB this week .

## 2024-06-04 NOTE — ED Notes (Signed)
 Echo at bedside

## 2024-06-04 NOTE — Consult Note (Addendum)
 "  Cardiology Consultation   Patient ID: Patrick Stout MRN: [id number]; DOB: [birth year]  Admit date: 06/04/2024 Date of Consult: 06/04/2024  PCP:  [facility] Providers Cardiologist:  Vinie JAYSON Maxcy, MD  Cardiology APP:  Rana Lum CROME, NP       Patient Profile: Patrick Stout is a 73 y.o. male with a history of CAD s/p CABG x5 (LIMA-LAD, RIMA-OM, SVG-D1, sequential SVG to PDA- right PLA) in 09/2009, hypertension, and hyperlipdiemia who is being seen 06/04/2024 for the evaluation of persistent hypertension, chest pressure, and palpitations at the request of Dr. Yolande.  History of Present Illness: Mr. [name] with the above history who is followed by Dr. Maxcy. He has a history of CAD s/p CABG x5 with LIMA to LAD, RIMA to OM, SVG to 1st Diag, and sequential SVG to PDA and right PLA in 09/2009. Myoview  in 2016 showed no evidence of ischemia. ABIs in 10/2020 were normal.  He was recently seen by Lum Rana, NP, on 05/29/2024 at which time he reported uncontrolled hypertension for the last 3-4 months despite changes to his antihypertensive regimen by his PCP. He also reported 2 episodes of palpitations that he described as frequent skipped beats. Metoprolol  was topped and he was started on Coreg  instead. Home Ramipril  was continued.  He was seen in the ED on 06/02/2024 for hypertension with systolic BP as high as the 200s as well as palpitations that he describes as a quivering sensation in his chest. EKG showed sinus bradycardia with rates in the 50s but no acute ischemic changes. High-sensitivity troponin was negative. He was given a dose of IV Hydralazine  with improvement in his BP and was discharged.   He returned to the ED today for recurrent symptoms. Upon arrival to the ED, BP as high as 183/95. EKG showed sinus bradycardia, rate 59 bpm, with no acute ischemic changes. High-sensitivity troponin negative x2. Chest x-ray showed no  acute findings. WBC 7.0, Hgb 14.4, Plts 192. Na 140, K 4.3, Glucose 108, BUN 20, Cr 1.12. Patient was admitted and Cardiology consulted for further evaluation.   Patient reports almost constant chest pain that he describes as both a squeezing sensation and a sharp pain that sometimes. Sometimes the sharp pain is so severe that it brings tears to his eye and he will have trouble speaking. He states active and denies any worsening chest pain with exertion. Pain is not positional or worse after meals. He states this pain has been constant for about 2 weeks but he was having it intermittently prior to this as well. He also reports new dyspnea on exertion, such as when walking up his driveway and playing with his grandchildren. He denies any chest pain prior to his CABG in 2011 but was having some dyspnea on exertion prior to that. No orthopnea, PND, or edema. He reports intermittent palpitations for a few weeks no that he describes as frequent skipped beats. He reports occasional very brief and mild lightheadedness (not related to the palpitations) but nothing significant. He had a syncopal episode in 06/2023 in setting of GI bug and dehydration but denies any other syncopal episodes. No recent fevers or illnesses. No abnormal bleeding in urine or stools.   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 Dusty)   KNEE SURGERY  2008   TRANSTHORACIC ECHOCARDIOGRAM  10/04/2009   EF 55-60%     Home Medications:  Prior to  Admission medications  Medication Sig Start Date End Date Taking? Authorizing Provider  aspirin  81 MG chewable tablet Chew 1 tablet (81 mg total) by mouth daily. 08/08/18   Hilty, Vinie BROCKS, MD  carvedilol  (COREG ) 25 MG tablet Take 1 tablet (25 mg total) by mouth 2 (two) times daily with a meal. 05/29/24   Fountain, Madison L, NP  EVENING PRIMROSE OIL PO Take 1 tablet by mouth 2 (two) times daily.     [provider]  Flaxseed, Linseed, (FLAXSEED OIL) 1000 MG CAPS Take 2 capsules by mouth 2 (two) times daily.     [provider]  folic acid  (FOLVITE ) 1 MG tablet TAKE 1 TABLET BY MOUTH EVERY DAY 01/23/24   Hilty, Vinie BROCKS, MD  metoprolol  succinate (TOPROL -XL) 50 MG 24 hr tablet 1 tablet Orally twice a day; Duration: 90 days    [provider]  Multiple Vitamin (MULTIVITAMIN WITH MINERALS) TABS Take 1 tablet by mouth daily.    [provider]  Omega-3 Fatty Acids (FISH OIL) 1000 MG CAPS Take 1,000 mg by mouth in the morning and at bedtime.    [provider]  ramipril  (ALTACE ) 10 MG capsule Take 1 capsule (10 mg total) by mouth 2 (two) times daily. 05/29/24   Rana Lum CROME, NP  rosuvastatin  (CRESTOR ) 40 MG tablet Take 1 tablet (40 mg total) by mouth daily. 08/08/18   Hilty, Vinie BROCKS, MD    Scheduled Meds:  amLODipine   2.5 mg Oral BID   [START ON 06/05/2024] aspirin   81 mg Oral Daily   carvedilol   12.5 mg Oral BID WC   heparin   5,000 Units Subcutaneous Q8H   multivitamin with minerals  1 tablet Oral Daily   pantoprazole   40 mg Oral BID   ramipril   10 mg Oral BID   rosuvastatin   40 mg Oral Daily   Continuous Infusions:  sodium chloride  100 mL/hr at 06/04/24 1530   PRN Meds: acetaminophen , ALPRAZolam , morphine  injection, nitroGLYCERIN , ondansetron  (ZOFRAN ) IV, ondansetron   Allergies:   Allergies[1]  Social History:   Social History   Socioeconomic History   Marital status: Married    Spouse name: Not on file   Number of children: 2    Years of education: Not on file   Highest education level: Not on file  Occupational History   Occupation: interior Copywriter, Advertising: [employer]  Tobacco Use   Smoking status: Never   Smokeless tobacco: Never  Substance and Sexual Activity   Alcohol use: Not Currently    Comment: socially   Drug use: No   Sexual activity: Not on file  Other Topics Concern   Not on file  Social History Narrative   Not on file   Social Drivers of Health   Tobacco Use: Low Risk (06/02/2024)   Patient History    Smoking Tobacco Use: Never    Smokeless Tobacco Use: Never    Passive Exposure: Not on file  Financial Resource Strain: Not on file  Food Insecurity: No Food Insecurity (06/04/2024)   Epic    Worried About Programme Researcher, Broadcasting/film/video in the Last Year: Never true    Ran Out of Food in the Last Year: Never true  Transportation Needs: No Transportation Needs (06/04/2024)   Epic    Lack of Transportation (Medical): No    Lack of Transportation (Non-Medical): No  Physical Activity: Not on file  Stress: Not on file  Social Connections: Socially Isolated (06/04/2024)   Social Connection and Isolation Panel    Frequency of Communication with Friends and Family: More than three times a week    Frequency of Social Gatherings with Friends and Family: More than three times a week    Attends Religious Services: Never    Database Administrator or Organizations: No    Attends Banker Meetings: Never    Marital Status: Widowed  Intimate Partner Violence: Not At Risk (06/04/2024)   Epic    Fear of Current or Ex-Partner: No    Emotionally Abused: No    Physically Abused: No    Sexually Abused: No  Depression (PHQ2-9): Not on file  Alcohol Screen: Not on file  Housing: Low Risk (06/04/2024)   Epic    Unable to Pay for Housing in the Last Year: No    Number of Times Moved in the Last Year: 0    Homeless in the Last Year: No  Utilities: Not At Risk (06/04/2024)   Epic     Threatened with loss of utilities: No  Health Literacy: Not on file    Family History:   Family History  Problem Relation Age of Onset   Heart attack Father    Coronary artery disease Father    Esophageal cancer Sister    Hypertension Sister    COPD Sister      ROS:  Please see the history of present illness.   Physical Exam/Data: Vitals:   06/04/24 1416 06/04/24 1515 06/04/24 1610 06/04/24 1611  BP:  (!) 152/93 (!) 157/79 (!) 157/79  Pulse:  (!) 59 (!) 55   Resp:  12 19   Temp: 98.2 F (36.8 C)     TempSrc: Oral     SpO2:  100% 95%    No intake or output data in the 24 hours ending 06/04/24 1625    05/29/2024    3:45 PM 11/01/2023    8:59 AM 08/12/2022    8:38 AM  Last 3 Weights  Weight (lbs) 183 lb 189 lb 193 lb 6.4 oz  Weight (kg) 83.008 kg 85.73 kg 87.726 kg     There is no height or weight on file to calculate BMI.  General: 73 y.o. Caucasian male resting comfortably in no acute distress. HEENT: Normocephalic and atraumatic. Sclera clear. EOMs intact. Neck: Supple. No carotid bruits. No JVD. Heart: RRR. Distinct S1 and S2. No murmurs, gallops, or rubs.  Lungs: No increased work of breathing. Clear to ausculation bilaterally. No wheezes, rhonchi, or rales.  Extremities: No lower extremity edema.  Skin: Warm and dry. Neuro: Alert and oriented x3. No focal deficits. Psych: Normal affect. Responds appropriately.   EKG:  The EKG was personally reviewed and demonstrates:  Sinus bradycardia, rate 59 bpm, with no acute ischemic changes.  Telemetry:  Telemetry was personally reviewed and demonstrates:  Sinus rhythm with rates in the 50s.  Relevant CV Studies:  Myoview  05/06/2015: Nuclear stress EF: 56%. The left ventricular ejection fraction is  normal (55-65%). Blood pressure demonstrated a hypertensive response to exercise. There was no ST segment deviation noted during stress. The study is normal.   Normal stress nuclear study with no ischemia or infarction; EF  56 with normal wall motion. _______________  ABIs/ TBIs 10/20/2020: Summary: Summary:  Right: Resting right ankle-brachial index is within normal range. No  evidence of significant right lower extremity arterial disease. The right  toe-brachial index is normal.   Left: Resting left ankle-brachial index is within normal range. No  evidence of significant left lower extremity arterial disease. The left  toe-brachial index is normal.    Laboratory Data: High Sensitivity Troponin:  No results for input(s): TROPONINIHS in the last 720 hours.  Recent Labs  Lab 06/02/24 1217 06/02/24 1417 06/04/24 0235 06/04/24 0503 06/04/24 1342  TRNPT <15 <15 15 <15 <15      Chemistry Recent Labs  Lab 06/02/24 1217 06/04/24 0235  NA 141 140  K 4.5 4.3  CL 105 102  CO2 28 28  GLUCOSE 101* 108*  BUN 12 20  CREATININE 1.07 1.12  CALCIUM  9.8 9.6  GFRNONAA >60 >60  ANIONGAP 9 10    No results for input(s): PROT, ALBUMIN, AST, ALT, ALKPHOS, BILITOT in the last 168 hours. Lipids No results for input(s): CHOL, TRIG, HDL, LABVLDL, LDLCALC, CHOLHDL in the last 168 hours.  Hematology Recent Labs  Lab 06/02/24 1217 06/04/24 0235  WBC 7.3 7.0  RBC 4.45 4.40  HGB 14.4 14.4  HCT 42.5 41.9  MCV 95.5 95.2  MCH 32.4 32.7  MCHC 33.9 34.4  RDW 11.9 11.9  PLT 193 192   Thyroid No results for input(s): TSH, FREET4 in the last 168 hours.  BNPNo results for input(s): BNP, PROBNP in the last 168 hours.  DDimer No results for input(s): DDIMER in the last 168 hours.  Radiology/Studies:  DG Chest 2 View Result Date: 06/04/2024 EXAM: PA AND LATERAL (2) VIEW(S) XRAY OF THE CHEST 06/04/2024 02:53:00 AM COMPARISON: Recent PA and Lateral 06/02/2024. CLINICAL HISTORY: chest pain FINDINGS: LUNGS AND PLEURA: The lungs are slightly emphysematous. No focal pulmonary opacity. No pleural effusion. No pneumothorax. HEART AND MEDIASTINUM: The heart is slightly enlarged.  Sternotomy and CABG changes. 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 BONES AND SOFT TISSUES: Osteopenia and thoracic spondylosis. IMPRESSION: 1. No acute findings. 2. Stable COPD chest. Electronically signed by: Francis Quam MD 06/04/2024 03:00 AM EST RP Workstation: [id number]   DG Chest 2 View Result Date: 06/02/2024 CLINICAL DATA:  Left-sided chest tightness. EXAM: CHEST - 2 VIEW COMPARISON:  11/10/2009 FINDINGS: The lungs are clear without focal pneumonia, edema, pneumothorax or pleural effusion. Cardiopericardial silhouette is at upper limits of normal for size. No acute bony abnormality. IMPRESSION: No active cardiopulmonary disease. Electronically Signed   By: Camellia Candle M.D.   On: 06/02/2024 13:51     Assessment and Plan:  Chest Pain Dyspnea on Exertion CAD s/p CABG History of CABG x5 in 2011. He presents now with constant chest pain for 2 weeks and new dyspnea on exertion.  EKG shows no sinus bradycardia with no acute ischemic changes. High-sensitivity troponin negative x2.  - He is still having some chest pain but appear comfortable.  - Will adjust antianginals. Will start Amlodipine  2.5mg  twice daily. He was previously started on Coreg  25mg  twice daily last week but did not tolerate this dose (due to fatigue and bradycardia in the 40s) so he restarted his Toprol -XL 50mg  twice daily. Will restart Coreg  at lower dose  of 12.5mg  twice daily. These will also help with his hypertension. - Continue Aspirin  81mg  daily and Crestor  40mg  daily. - Chest pain sounds more atypically; however, dyspnea is similar to the symptoms he had prior to his CABG. Will adjust antianginal as above. Will get Echo and Myoview .  Palpitations Patient reports intermittent palpitations that he describes as frequent skipped beats. Zio monitor was ordered at office visit last week but has not arrived yet. - EKG and telemetry shows sinus rhythm with rate in the 50s. - Will stop Toprol -XL and  transition to Coreg  12.5mg  twice daily as above. - Continue to monitor on telemetry.  Hypertension BP has been elevated (systolic BP in the 160s to 200s) over the last few months. Systolic BP was initially in the 180s on arrival but has improved to the 140s to 150s.  - Will start Amlodipine  2.5mg  twice daily and will likely consolidate prior to discharge. - Will stop Toprol -XL and start Coreg  12.5mg  twice daily instead. - Continue Ramipril  10mg  twice daily.  Hyperlipidemia - Continue Crestor  40mg  daily. - Will recheck lipid panel in the morning.   Risk Assessment/Risk Scores:  For questions or updates, please contact [facility] Please consult [email or web address] for contact info under   Signed, Callie E Goodrich, PA-C  06/04/2024 4:25 PM  Patient seen and examined   I agree with findings as noted by C Jadine Pt is a 73 yo with Hx of CAD (s/p CABG), HTN, HL   PT seen in ER on 06/02/24 for HTN  Given IV hydralazine    Sent home    Presents today with CP and elevated BP PT describes pain as initially intermitt, now constant   Pressure that can be sharp   Has taken him to the wall about 4 x    Once earlier today while in ER    Some pleuritic component to it, worse with expiration.  No cough Pt also notes more DOE with activities that he could tolerate in the past   On exam Neck  JVP is normal Lungs  CTA Chest   Nontender to palp L chest Cardiac RRR  No murmurs   No S3 Abd is benign Ext are without edema  Impression CP  Atypical for angina   More concerning is some DOE Would get echo today    If LVEF is normal set up for myoview    HTN  BP has been elevated   Would try switching to carvedilol  12.5 bid and add amlodipine  2.5 bid    Follow   Vina Gull MD      [1] No Known Allergies  "

## 2024-06-04 NOTE — ED Notes (Signed)
 Pt brought back to triage for repeat troponin draw, pt stated chest pain was increased from earlier. Repeat EKG done and RN and MD notified.

## 2024-06-04 NOTE — Assessment & Plan Note (Signed)
 Continue statins

## 2024-06-04 NOTE — ED Provider Notes (Signed)
 " [place] [facility] Provider Note   CSN: [id number] Arrival date & time: 06/04/24  0209     Patient presents with: Chest Pain /Palpitations Patrick Stout   JENTZEN MINASYAN is a 73 y.o. male.   73 year old male history of CAD status post CABG, hypertension, hyperlipidemia, and palpitations who presents to the emergency department with chest pain and palpitations.  Patient reports that over the past month he has been having intermittent chest pain and palpitations.  Chest pain is becoming more constant.  Left-sided.  Pressure-like.  Exertional.  Currently 3/10 in severity.  Occasionally will become more severe.  No diaphoresis or vomiting.  No radiation of the pain.  Has not tried nitroglycerin  for it.  Has been off Viagra for several weeks.  Came to the emergency department several days ago with reassuring evaluation.  Was discharged home with instructions to follow-up with cardiology who is going to send him a Holter monitor but says that the symptoms have persisted and they referred him back to the emergency department for evaluation.  Last stress test was in 2016 that showed normal EF without any evidence of ischemia.       Prior to Admission medications  Medication Sig Start Date End Date Taking? Authorizing Provider  aspirin  81 MG chewable tablet Chew 1 tablet (81 mg total) by mouth daily. 08/08/18   Hilty, Vinie BROCKS, MD  carvedilol  (COREG ) 25 MG tablet Take 1 tablet (25 mg total) by mouth 2 (two) times daily with a meal. 05/29/24   Fountain, Madison L, NP  EVENING PRIMROSE OIL PO Take 1 tablet by mouth 2 (two) times daily.     [provider]  Flaxseed, Linseed, (FLAXSEED OIL) 1000 MG CAPS Take 2 capsules by mouth 2 (two) times daily.     [provider]  folic acid  (FOLVITE ) 1 MG tablet TAKE 1 TABLET BY MOUTH EVERY DAY 01/23/24   Hilty, Vinie BROCKS, MD  metoprolol  succinate (TOPROL -XL) 50 MG 24 hr tablet 1 tablet Orally twice a day; Duration:  90 days    [provider]  Multiple Vitamin (MULTIVITAMIN WITH MINERALS) TABS Take 1 tablet by mouth daily.    [provider]  Omega-3 Fatty Acids (FISH OIL) 1000 MG CAPS Take 1,000 mg by mouth in the morning and at bedtime.    [provider]  ramipril  (ALTACE ) 10 MG capsule Take 1 capsule (10 mg total) by mouth 2 (two) times daily. 05/29/24   Rana Lum CROME, NP  rosuvastatin  (CRESTOR ) 40 MG tablet Take 1 tablet (40 mg total) by mouth daily. 08/08/18   Hilty, Vinie BROCKS, MD    Allergies: Patient has no known allergies.    Review of Systems  Updated Vital Signs BP 116/69 (BP Location: Right Arm)   Pulse 66   Temp 98.4 F (36.9 C) (Oral)   Resp 18   Ht 5' 8 (1.727 m)   Wt (P) 82.1 kg   SpO2 94%   BMI (P) 27.54 kg/m   Physical Exam Vitals and nursing note reviewed.  Constitutional:      General: He is not in acute distress.    Appearance: He is well-developed.  HENT:     Head: Normocephalic and atraumatic.     Right Ear: External ear normal.     Left Ear: External ear normal.     Nose: Nose normal.  Eyes:     Extraocular Movements: Extraocular movements intact.     Conjunctiva/sclera: Conjunctivae normal.  Pupils: Pupils are equal, round, and reactive to light.  Cardiovascular:     Rate and Rhythm: Normal rate and regular rhythm.     Heart sounds: Normal heart sounds.  Pulmonary:     Effort: Pulmonary effort is normal. No respiratory distress.     Breath sounds: Normal breath sounds.  Musculoskeletal:     Cervical back: Normal range of motion and neck supple.     Right lower leg: Edema (1+) present.     Left lower leg: Edema (1+) present.  Skin:    General: Skin is warm and dry.  Neurological:     Mental Status: He is alert. Mental status is at baseline.  Psychiatric:        Mood and Affect: Mood normal.        Behavior: Behavior normal.     (all labs ordered are listed, but only abnormal results are displayed) Labs Reviewed   BASIC METABOLIC PANEL WITH GFR - Abnormal; Notable for the following components:      Result Value   Glucose, Bld 108 (*)    All other components within normal limits  CBC  CREATININE, SERUM  LIPID PANEL  TROPONIN T, HIGH SENSITIVITY  TROPONIN T, HIGH SENSITIVITY  TROPONIN T, HIGH SENSITIVITY    EKG: EKG Interpretation Date/Time:  Monday June 04 2024 13:47:42 EST Ventricular Rate:  59 PR Interval:  200 QRS Duration:  98 QT Interval:  440 QTC Calculation: 435 R Axis:   32  Text Interpretation: Sinus bradycardia Incomplete right bundle branch block Borderline ECG When compared with ECG of 04-Jun-2024 02:20, PREVIOUS ECG IS PRESENT Confirmed by Yolande Charleston [phone]) on 06/04/2024 2:09:53 PM  Radiology: ECHOCARDIOGRAM COMPLETE Result Date: 06/04/2024    ECHOCARDIOGRAM REPORT   Patient Name:   Patrick Stout Date of Exam: 06/04/2024 Medical Rec #:  [id number]      Height:       68.0 in Accession #:    [id number]     Weight:       183.0 lb Date of Birth:  07/30/1950      BSA:          1.968 m Patient Age:    73 years       BP:           157/79 mmHg Patient Gender: M              HR:           55 bpm. Exam Location:  Inpatient Procedure: 2D Echo, Cardiac Doppler, Color Doppler and Intracardiac            Opacification Agent (Both Spectral and Color Flow Doppler were            utilized during procedure). Indications:    Chest pain  History:        Patient has no prior history of Echocardiogram examinations.                 CAD, Prior CABG; Risk Factors:Dyslipidemia and Hypertension.  Sonographer:    Meagan Baucom RDCS, FE, PE Referring Phys: [id number] PAULA V ROSS IMPRESSIONS  1. Left ventricular ejection fraction, by estimation, is 55 to 60%. The left ventricle has normal function. The left ventricle has no regional wall motion abnormalities. Left ventricular diastolic parameters are consistent with Grade I diastolic dysfunction (impaired relaxation).  2. Right ventricular systolic function  is normal. The right ventricular size is normal.  3. Left atrial size was mildly dilated.  4. The mitral valve is normal in structure. No evidence of mitral valve regurgitation. No evidence of mitral stenosis.  5. The aortic valve is tricuspid. Aortic valve regurgitation is mild to moderate. Aortic valve sclerosis is present, with no evidence of aortic valve stenosis.  6. There is mild dilatation of the ascending aorta, measuring 41 mm.  7. The inferior vena cava is normal in size with greater than 50% respiratory variability, suggesting right atrial pressure of 3 mmHg. FINDINGS  Left Ventricle: Left ventricular ejection fraction, by estimation, is 55 to 60%. The left ventricle has normal function. The left ventricle has no regional wall motion abnormalities. Definity  contrast agent was given IV to delineate the left ventricular  endocardial borders. The left ventricular internal cavity size was normal in size. There is no left ventricular hypertrophy. Left ventricular diastolic parameters are consistent with Grade I diastolic dysfunction (impaired relaxation). Right Ventricle: The right ventricular size is normal. No increase in right ventricular wall thickness. Right ventricular systolic function is normal. Left Atrium: Left atrial size was mildly dilated. Right Atrium: Right atrial size was normal in size. Pericardium: There is no evidence of pericardial effusion. Mitral Valve: The mitral valve is normal in structure. No evidence of mitral valve regurgitation. No evidence of mitral valve stenosis. Tricuspid Valve: The tricuspid valve is normal in structure. Tricuspid valve regurgitation is mild . No evidence of tricuspid stenosis. Aortic Valve: The aortic valve is tricuspid. Aortic valve regurgitation is mild to moderate. Aortic valve sclerosis is present, with no evidence of aortic valve stenosis. Pulmonic Valve: The pulmonic valve was normal in structure. Pulmonic valve regurgitation is mild. No evidence of  pulmonic stenosis. Aorta: The aortic root is normal in size and structure. There is mild dilatation of the ascending aorta, measuring 41 mm. Venous: The inferior vena cava is normal in size with greater than 50% respiratory variability, suggesting right atrial pressure of 3 mmHg. IAS/Shunts: No atrial level shunt detected by color flow Doppler.  LEFT VENTRICLE PLAX 2D LVIDd:         5.10 cm   Diastology LVIDs:         3.20 cm   LV e' medial:    5.55 cm/s LV PW:         1.10 cm   LV E/e' medial:  10.0 LV IVS:        1.20 cm   LV e' lateral:   5.87 cm/s LVOT diam:     2.40 cm   LV E/e' lateral: 9.5 LV SV:         122 LV SV Index:   62 LVOT Area:     4.52 cm  RIGHT VENTRICLE RV S prime:     11.60 cm/s TAPSE (M-mode): 1.6 cm LEFT ATRIUM             Index        RIGHT ATRIUM           Index LA diam:        3.60 cm 1.83 cm/m   RA Area:     19.80 cm LA Vol (A2C):   52.3 ml 26.57 ml/m  RA Volume:   48.00 ml  24.39 ml/m LA Vol (A4C):   63.0 ml 32.01 ml/m LA Biplane Vol: 60.3 ml 30.64 ml/m  AORTIC VALVE LVOT Vmax:   124.00 cm/s LVOT Vmean:  74.400 cm/s LVOT VTI:    0.269 m  AORTA Ao Root diam: 4.20 cm Ao Asc diam:  4.00 cm  MITRAL VALVE MV Area (PHT): 2.07 cm    SHUNTS MV Decel Time: 366 msec    Systemic VTI:  0.27 m MV E velocity: 55.50 cm/s  Systemic Diam: 2.40 cm MV A velocity: 77.60 cm/s MV E/A ratio:  0.72 Morene Brownie Electronically signed by Morene Brownie Signature Date/Time: 06/04/2024/5:38:25 PM    Final    DG Chest 2 View Result Date: 06/04/2024 EXAM: PA AND LATERAL (2) VIEW(S) XRAY OF THE CHEST 06/04/2024 02:53:00 AM COMPARISON: Recent PA and Lateral 06/02/2024. CLINICAL HISTORY: chest pain FINDINGS: LUNGS AND PLEURA: The lungs are slightly emphysematous. No focal pulmonary opacity. No pleural effusion. No pneumothorax. HEART AND MEDIASTINUM: The heart is slightly enlarged. Sternotomy and CABG changes. 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 BONES AND SOFT TISSUES: Osteopenia  and thoracic spondylosis. IMPRESSION: 1. No acute findings. 2. Stable COPD chest. Electronically signed by: Francis Quam MD 06/04/2024 03:00 AM EST RP Workstation: [id number]     Procedures   Medications Ordered in the ED  heparin  injection 5,000 Units (5,000 Units Subcutaneous Given 06/04/24 1524)  0.9 %  sodium chloride  infusion ( Intravenous New Bag/Given 06/04/24 1530)  acetaminophen  (TYLENOL ) tablet 650 mg (has no administration in time range)  ondansetron  (ZOFRAN ) injection 4 mg (has no administration in time range)  ondansetron  (ZOFRAN -ODT) disintegrating tablet 4 mg (has no administration in time range)  nitroGLYCERIN  (NITROSTAT ) SL tablet 0.4 mg (has no administration in time range)  multivitamin with minerals tablet 1 tablet (1 tablet Oral Given 06/04/24 1522)  rosuvastatin  (CRESTOR ) tablet 40 mg (40 mg Oral Given 06/04/24 1522)  ramipril  (ALTACE ) capsule 10 mg (10 mg Oral Given 06/04/24 1612)  ALPRAZolam  (XANAX ) tablet 0.5 mg (has no administration in time range)  pantoprazole  (PROTONIX ) EC tablet 40 mg (40 mg Oral Given 06/04/24 1522)  morphine  (PF) 2 MG/ML injection 2 mg (has no administration in time range)  aspirin  chewable tablet 81 mg (has no administration in time range)  amLODipine  (NORVASC ) tablet 2.5 mg (2.5 mg Oral Given 06/04/24 1611)  carvedilol  (COREG ) tablet 12.5 mg (12.5 mg Oral Given 06/04/24 1812)  perflutren  lipid microspheres (DEFINITY ) IV suspension (3 mLs Intravenous Given 06/04/24 1724)  nitroGLYCERIN  (NITROSTAT ) SL tablet 0.4 mg (0.4 mg Sublingual Given 06/04/24 1027)  aspirin  chewable tablet 324 mg (324 mg Oral Given 06/04/24 1049)  nitroGLYCERIN  (NITROSTAT ) SL tablet 0.4 mg (0.4 mg Sublingual Given 06/04/24 1336)  morphine  (PF) 4 MG/ML injection 4 mg (4 mg Intravenous Given 06/04/24 1341)  ondansetron  (ZOFRAN ) injection 4 mg (4 mg Intravenous Given 06/04/24 1341)  alum & mag hydroxide-simeth (MAALOX/MYLANTA) 200-200-20 MG/5ML suspension 30 mL (30 mLs  Oral Given 06/04/24 1522)  dicyclomine  (BENTYL ) capsule 10 mg (10 mg Oral Given 06/04/24 1522)    Clinical Course as of 06/04/24 2026  Mon Jun 04, 2024  1333 Discussed with Vina Gull MD from cardiology consulted and will see the patient [RP]  1409 Dr Hulan consulted for admission [RP]    Clinical Course User Index [RP] Yolande Lamar BROCKS, MD                                 Medical Decision Making Amount and/or Complexity of Data Reviewed Labs: ordered. Radiology: ordered.  Risk OTC drugs. Prescription drug management. Decision regarding hospitalization.   Patrick Stout is a 73 year old male history of CAD status post CABG, hypertension, hyperlipidemia, and palpitations who presents to the emergency department with chest pain and palpitations  Initial Ddx:  Hypertensive emergency, MI, PE, pneumonia, dissection, pericarditis, costochondritis, reflux  MDM:  Patient presents with chest pain and palpitations.  Also has had elevated blood pressures at home.  On exam was hypertensive.  Considering hypertensive emergency.  With the patient's chest discomfort will obtain EKG and troponins to evaluate for MI.  Also considering pulmonary embolism but patient is not hypoxic or tachycardic or having significant respiratory symptoms feel that is less likely.  Considered dissection but with their symmetric pulses, history, and description of the pain feel it is less likely.  If chest x-ray reveals widened mediastinum or any other concerning findings will consider CTA.  Also considered pericarditis but description is unlikely and they do not have risk factors for this diagnosis.  Chest pain not reproducible so feel it costochondritis less likely.  No infectious symptoms to suggest pneumonia at this time that would be causing pleuritic chest pain.  Plan:  Labs Troponin EKG Chest x-ray Aspirin  Nitroglycerin   ED Summary/Re-evaluation:  EKG shows sinus bradycardia.  Serial troponins  undetectably low x 2.  CBC and CMP unremarkable.  Chest x-ray did not show acute findings.  Upon reevaluation patient was having persistent pain.  Did report that it had resolved for short time with the nitroglycerin  but had started to recur and so was given additional nitroglycerin  dose as well as morphine .  Blood pressure improved after these medications.  I suspect that some of this blood pressure elevation is due to his pain and stress. Admitted to the hospitalist due to his concerning story and cardiology was consulted prior to admission  This patient presents to the ED for concern of complaints listed in HPI, this involves an extensive number of treatment options, and is a complaint that carries with it a high risk of complications and morbidity. Disposition including potential need for admission considered.   Dispo: Admit to Floor  Additional history obtained from spouse Records reviewed Outpatient Clinic Notes The following labs were independently interpreted: Chemistry and show no acute abnormality I independently reviewed the following imaging with scope of interpretation limited to determining acute life threatening conditions related to emergency care: Chest x-ray and agree with the radiologist interpretation with the following exceptions: none I personally reviewed and interpreted cardiac monitoring: Sinus bradycardia I personally reviewed and interpreted the pt's EKG: see above for interpretation  I have reviewed the patients home medications and made adjustments as needed Consults: Cardiology Social Determinants of health:  Geriatric    Final diagnoses:  Chest pain, unspecified type  Coronary artery disease with history of myocardial infarction without history of CABG    ED Discharge Orders     None          Yolande Lamar BROCKS, MD 06/04/24 2027  "

## 2024-06-04 NOTE — Telephone Encounter (Signed)
" ° °  Patient called Answering Service. Called and spoke with patient. He is currently in the waiting room of the ED. He presented with persistent hypertension and a feeling like his heart is quivering. He was seen in the ED on 06/02/2024 for similar concerns and was discharged home. He states he needs to see a Development Worker, [employer]. Explained that we have to let the ED work him up first but reassured him that we are here if they need us . He voiced understanding. Suspect that we will be consulted once he is evaluated by an ED provider given this is his second ED visit within 2 days.   Patrick Stout E Tiffanny Lamarche, PA-C 06/04/2024 7:13 AM   "

## 2024-06-04 NOTE — Plan of Care (Signed)
" °  Problem: Activity: Goal: Ability to tolerate increased activity will improve Outcome: Progressing   Problem: Cardiac: Goal: Ability to achieve and maintain adequate cardiovascular perfusion will improve Outcome: Progressing   Problem: Education: Goal: Knowledge of General Education information will improve Description: Including pain rating scale, medication(s)/side effects and non-pharmacologic comfort measures Outcome: Progressing   Problem: Clinical Measurements: Goal: Ability to maintain clinical measurements within normal limits will improve Outcome: Progressing Goal: Diagnostic test results will improve Outcome: Progressing   Problem: Activity: Goal: Risk for activity intolerance will decrease Outcome: Progressing   Problem: Pain Managment: Goal: General experience of comfort will improve and/or be controlled Outcome: Progressing   Problem: Health Behavior/Discharge Planning: Goal: Ability to safely manage health-related needs after discharge will improve Outcome: Completed/Met   Problem: Nutrition: Goal: Adequate nutrition will be maintained Outcome: Completed/Met   Problem: Coping: Goal: Level of anxiety will decrease Outcome: Completed/Met   Problem: Elimination: Goal: Will not experience complications related to bowel motility Outcome: Completed/Met Goal: Will not experience complications related to urinary retention Outcome: Completed/Met   "

## 2024-06-04 NOTE — Assessment & Plan Note (Addendum)
-   Admitted to telemetry floor under close observation -Cardiogram x 3 sets < 15 - EKG reviewed, nonspecific changes -Urine nitroglycerin  -As needed analgesia, morphine  -Continue aspirin , statins, beta-blockers -Cardiology consulted, appreciate further evaluation recommendation - NPO after midnight - As needed GI cocktail, Maalox, Protonix

## 2024-06-04 NOTE — Hospital Course (Signed)
 Patrick Stout is a 73 yo Male with history of CAD, S/p CABG X5 (09/2009),  HTN, HLD.SABRASABRAPresenting with a onset of palpitation, chest pain.  He has been having substernal chest pain and palpitation for over a month now, now it has progressively has gotten worse.  Chest pain is becoming more constant, on the left side.  Pressure type pain worse with exertion but no radiation. Pain intermittently gets worse.  Not associate with shortness of breath or diaphoresis. Patient was seen in ED on 12/27 for same complaint workup was negative patient was discharged recommended follow-up with cardiology for possible Zio patch monitoring.    ED Evaluation: Blood pressure (!) 147/91, pulse 62, temperature 98.2 F (36.8 C), temperature source Oral, resp. rate 14, SpO2 100%. LABs: CBC CMP reviewed within normal limits, troponin < 15 x 3, EKG: EKG sinus bradycardia with a rate of 59, negative for any ST elevation or depression QTc 435  EDP Dr. Jakie discussed the case with cardiology-wish to bring the patient in for further evaluation and cardiac workup.

## 2024-06-05 ENCOUNTER — Other Ambulatory Visit (HOSPITAL_COMMUNITY): Payer: Self-pay

## 2024-06-05 ENCOUNTER — Encounter (HOSPITAL_COMMUNITY): Payer: Self-pay | Admitting: Family Medicine

## 2024-06-05 ENCOUNTER — Observation Stay (HOSPITAL_COMMUNITY)

## 2024-06-05 DIAGNOSIS — I10 Essential (primary) hypertension: Secondary | ICD-10-CM | POA: Diagnosis not present

## 2024-06-05 DIAGNOSIS — E785 Hyperlipidemia, unspecified: Secondary | ICD-10-CM

## 2024-06-05 DIAGNOSIS — R0609 Other forms of dyspnea: Secondary | ICD-10-CM | POA: Diagnosis not present

## 2024-06-05 DIAGNOSIS — R079 Chest pain, unspecified: Secondary | ICD-10-CM | POA: Diagnosis not present

## 2024-06-05 LAB — LIPID PANEL
Cholesterol: 119 mg/dL (ref 0–200)
HDL: 43 mg/dL
LDL Cholesterol: 60 mg/dL (ref 0–99)
Total CHOL/HDL Ratio: 2.8 ratio
Triglycerides: 80 mg/dL
VLDL: 16 mg/dL (ref 0–40)

## 2024-06-05 MED ORDER — REGADENOSON 0.4 MG/5ML IV SOLN
0.4000 mg | Freq: Once | INTRAVENOUS | Status: AC
  Administered 2024-06-05: 0.4 mg via INTRAVENOUS

## 2024-06-05 MED ORDER — TECHNETIUM TC 99M TETROFOSMIN IV KIT
30.0000 | PACK | Freq: Once | INTRAVENOUS | Status: AC
  Administered 2024-06-05: 30.7 via INTRAVENOUS

## 2024-06-05 MED ORDER — REGADENOSON 0.4 MG/5ML IV SOLN
INTRAVENOUS | Status: AC
  Filled 2024-06-05: qty 5

## 2024-06-05 MED ORDER — TECHNETIUM TC 99M TETROFOSMIN IV KIT
10.0000 | PACK | Freq: Once | INTRAVENOUS | Status: AC
  Administered 2024-06-05: 10.6 via INTRAVENOUS

## 2024-06-05 NOTE — Plan of Care (Signed)
" °  Problem: Education: Goal: Understanding of cardiac disease, CV risk reduction, and recovery process will improve Outcome: Progressing Goal: Individualized Educational Video(s) Outcome: Progressing   Problem: Activity: Goal: Ability to tolerate increased activity will improve Outcome: Progressing   Problem: Cardiac: Goal: Ability to achieve and maintain adequate cardiovascular perfusion will improve Outcome: Progressing   Problem: Education: Goal: Knowledge of General Education information will improve Description: Including pain rating scale, medication(s)/side effects and non-pharmacologic comfort measures Outcome: Progressing   Problem: Health Behavior/Discharge Planning: Goal: Ability to manage health-related needs will improve Outcome: Progressing   Problem: Clinical Measurements: Goal: Ability to maintain clinical measurements within normal limits will improve Outcome: Progressing Goal: Will remain free from infection Outcome: Progressing Goal: Diagnostic test results will improve Outcome: Progressing Goal: Respiratory complications will improve Outcome: Progressing Goal: Cardiovascular complication will be avoided Outcome: Progressing   Problem: Activity: Goal: Risk for activity intolerance will decrease Outcome: Progressing   Problem: Pain Managment: Goal: General experience of comfort will improve and/or be controlled Outcome: Progressing   Problem: Safety: Goal: Ability to remain free from injury will improve Outcome: Progressing   Problem: Skin Integrity: Goal: Risk for impaired skin integrity will decrease Outcome: Progressing   "

## 2024-06-05 NOTE — Progress Notes (Signed)
" °  Progress Note   Patient: Patrick Stout FMW:[id number] DOB: 1950-12-20 DOA: 06/04/2024     0 DOS: the patient was seen and examined on 06/05/2024        Brief hospital course: 73 y.o. M with CAD s/p CABG 2011, HTN who presented with new severe range hypertension in the last 3 months, associated with palpitations and chest pain.      Assessment and Plan: * Chest pain of uncertain etiology Ruled out ischemia.    Coronary artery disease involving coronary bypass graft of native heart without angina pectoris - aspirin  81 daily  Hypertensive urgency BP still >175 - Continue new amlodipine , Coreg  - Continue Ramipril    Dyslipidemia - Continue Crestor           Subjective: Still with palpitations, feeling chest discomfort.  BP still >170     Physical Exam: BP 129/75 (BP Location: Right Arm)   Pulse (!) 58   Temp 97.9 F (36.6 C) (Oral)   Resp 18   Ht 5' 8 (1.727 m)   Wt (P) 82.1 kg   SpO2 94%   BMI (P) 27.54 kg/m   Stout: Pt is alert, awake, restless Cardiovascular: RRR, nl S1-S2, no murmurs appreciated.   No LE edema.   Respiratory: Normal respiratory rate and rhythm.  CTAB without rales or wheezes. Neuro/Psych: Strength symmetric in upper and lower extremities.  Judgment and insight appear normal.   Data Reviewed: Discussed with Cardiology Echocardiogram normal    Family Communication: Daughters at the bedside    Disposition: Status is: Observation         Author: Lonni SHAUNNA Dalton, MD 06/05/2024 6:55 PM  For on call review [email or web address].    "

## 2024-06-05 NOTE — Progress Notes (Addendum)
 "  Progress Note  Patient Name: Patrick Stout Date of Encounter: 06/05/2024 [facility] Cardiologist: Vinie JAYSON Maxcy, MD   Interval Summary   Was able to walk around the unit without chest pain or SOB.  Did have one episode of chest discomfort overnight that patient attributed to sleeping on left side which is common for him since his CABG.  Does not have SL NTG at home  Vital Signs Vitals:   06/04/24 1754 06/04/24 2012 06/05/24 0013 06/05/24 0602  BP: (!) 154/79 116/69 125/68 (!) (P) 152/76  Pulse: (!) 59 66 (!) 57 (!) (P) 54  Resp: 17 18 18  (P) 18  Temp: 97.8 F (36.6 C) 98.4 F (36.9 C) 97.9 F (36.6 C) (P) 98.6 F (37 C)  TempSrc: Oral Oral Oral (P) Oral  SpO2: 97% 94% 96% (P) 97%  Weight: (P) 82.1 kg     Height: 5' 8 (1.727 m)       Intake/Output Summary (Last 24 hours) at 06/05/2024 0743 Last data filed at 06/05/2024 0202 Gross per 24 hour  Intake 833.29 ml  Output --  Net 833.29 ml      06/04/2024    5:54 PM 05/29/2024    3:45 PM 11/01/2023    8:59 AM  Last 3 Weights  Weight (lbs) 181 lb 1.6 oz 183 lb 189 lb  Weight (kg) 82.146 kg 83.008 kg 85.73 kg      Telemetry/ECG  Sinus bradycardia possible 1st AV block HR ~ avg 58 - Personally Reviewed  Physical Exam  GEN: No acute distress.   Cardiac: regular rhythm, bradycardic rate, no murmurs, rubs, or gallops.  Respiratory: Clear to auscultation bilaterally. MS: No edema, DP 2+  Assessment & Plan  Patrick Stout is a 73 y.o. male with a history of CAD s/p CABG x5 (LIMA-LAD, RIMA-OM, SVG-D1, sequential SVG to PDA- right PLA) in 09/2009, hypertension, and hyperlipdiemia who presented to the ED 12/29 for  persistent hypertension, chest pressure, and palpitations. In the ED patient was hypertensive BP: 183/95 and in sinus rhythm HR 59. Negative troponin.  Cardiology consulted.   CAD s/p CABG DOE Atypical chest pain Per chart review was able to ambulate unit without angina.  Currently chest pain  free Echo showed LVEF 55-60% with no RWMA. G1DD. Mild to moderate AR.  Antianginal titrated yesterday with positive effect, and Myoview  planned for today for further evaluation. Follow up on results.   Continue ASA 81 mg Continue coreg  12.5 mg BID [did not tolerate higher doses 2/2 bradycardia/fatigue] Continue amlodipine  2.5 mg Ensure patient has SL NG at discharge  Palpitations Telemetry shows sinus bradycardia Zio monitor ordered outpatient and still pending arrival through the mail to home.   PR prolongation noted with coreg  start, will obtain another 12 lead this afternoon. Patient is asymptomatic. Would not titrate AV nodal blocking agents any further.  Continue coreg  as above  Hypertension BP: 151/91 [prior to morning medications] Will hold on further titration as he has not had his BP medications this morning, prior BP recording 125/68. 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 Continue to monitor outpatient.  Otherwise per primary    For questions or updates, please contact [facility] Please consult [email or web address] for contact info under       Signed, Leontine LOISE Salen, PA-C  Pt seen and examined   I agree with findings as noted above by L Lockwood Pt feeling good today   No further spells of CP today like he had yesterday  Lungs are CTA Cardiac  RRR  No signif murmurs Abd   Benign Ext are without edema  Echo yesterday  LVEF and RVEF normal  Myoview  ordered  CP  Atypical    I am not sure what was causing.  I am not convinced it is cardiac   More concerning was some DOE    Myoview  today      Cardiac   As above  HTN  BP is better   Will keep on current regimen   Follow as outpt  HL  Keep on Crestor   If myoview  is normal he can go home today  Vina Gull MD   "

## 2024-06-05 NOTE — Progress Notes (Signed)
 Pt walked multiple laps of entire nursing unit at a fast pace. Pt denies CP/SOB or other discomfort.

## 2024-06-05 NOTE — Progress Notes (Signed)
" °  ° °  Patrick Stout presented for a nuclear stress test today.  I Leontine LOISE Salen, PA-C, provided direct supervision and was present during the stress portion of the study today, which was completed without immediate complications or acute ST/T changes on ECG. Patient did experience chest discomfort that was mild and improved by the end of the test.   Stress imaging is pending at this time.  Preliminary ECG findings may be listed in the chart, but the stress test result will not be finalized until perfusion imaging is complete.  Leontine LOISE Salen, PA-C  06/05/2024, 11:21 AM    "

## 2024-06-05 NOTE — Progress Notes (Signed)
" ° °  Brief Progress Note   _____________________________________________________________________________________________________________  Patient Name: Patrick Stout Patient DOB: 1951/04/30 Date: @TODAY @      Data: Received request from Dr. Jonel to assist in getting Nuclear Medicine Scan read for potential discharge.    Action: [employer] [facility] reading room via telephone.    Response:  Assured patient scan would be read shortly.  _____________________________________________________________________________________________________________  [facility] RN [employer] [place], Corean Lobstein Please contact us  directly via secure chat (search for [facility]) or by calling us  at [phone] [facility]  "

## 2024-06-06 ENCOUNTER — Encounter (HOSPITAL_COMMUNITY)
Admission: EM | Disposition: A | Discharge status: Home / Self Care | Payer: Self-pay | Source: Home / Self Care | Attending: Family Medicine

## 2024-06-06 DIAGNOSIS — I2510 Atherosclerotic heart disease of native coronary artery without angina pectoris: Secondary | ICD-10-CM

## 2024-06-06 DIAGNOSIS — I160 Hypertensive urgency: Secondary | ICD-10-CM | POA: Diagnosis present

## 2024-06-06 DIAGNOSIS — Z7982 Long term (current) use of aspirin: Secondary | ICD-10-CM | POA: Diagnosis not present

## 2024-06-06 DIAGNOSIS — I77810 Thoracic aortic ectasia: Secondary | ICD-10-CM | POA: Diagnosis present

## 2024-06-06 DIAGNOSIS — Z800 Family history of malignant neoplasm of digestive organs: Secondary | ICD-10-CM | POA: Diagnosis not present

## 2024-06-06 DIAGNOSIS — E7800 Pure hypercholesterolemia, unspecified: Secondary | ICD-10-CM | POA: Diagnosis present

## 2024-06-06 DIAGNOSIS — I25810 Atherosclerosis of coronary artery bypass graft(s) without angina pectoris: Secondary | ICD-10-CM

## 2024-06-06 DIAGNOSIS — E860 Dehydration: Secondary | ICD-10-CM | POA: Diagnosis present

## 2024-06-06 DIAGNOSIS — Z8249 Family history of ischemic heart disease and other diseases of the circulatory system: Secondary | ICD-10-CM | POA: Diagnosis not present

## 2024-06-06 DIAGNOSIS — I2583 Coronary atherosclerosis due to lipid rich plaque: Secondary | ICD-10-CM | POA: Diagnosis present

## 2024-06-06 DIAGNOSIS — R079 Chest pain, unspecified: Secondary | ICD-10-CM

## 2024-06-06 DIAGNOSIS — I25118 Atherosclerotic heart disease of native coronary artery with other forms of angina pectoris: Secondary | ICD-10-CM | POA: Diagnosis present

## 2024-06-06 DIAGNOSIS — I252 Old myocardial infarction: Secondary | ICD-10-CM | POA: Diagnosis not present

## 2024-06-06 DIAGNOSIS — Z79899 Other long term (current) drug therapy: Secondary | ICD-10-CM | POA: Diagnosis not present

## 2024-06-06 DIAGNOSIS — R001 Bradycardia, unspecified: Secondary | ICD-10-CM | POA: Diagnosis present

## 2024-06-06 DIAGNOSIS — Z951 Presence of aortocoronary bypass graft: Secondary | ICD-10-CM | POA: Diagnosis not present

## 2024-06-06 DIAGNOSIS — Z825 Family history of asthma and other chronic lower respiratory diseases: Secondary | ICD-10-CM | POA: Diagnosis not present

## 2024-06-06 DIAGNOSIS — I10 Essential (primary) hypertension: Secondary | ICD-10-CM | POA: Diagnosis not present

## 2024-06-06 DIAGNOSIS — R002 Palpitations: Secondary | ICD-10-CM | POA: Diagnosis present

## 2024-06-06 HISTORY — PX: LEFT HEART CATH AND CORS/GRAFTS ANGIOGRAPHY: CATH118250

## 2024-06-06 LAB — NM MYOCAR MULTI W/SPECT W/WALL MOTION / EF
Estimated workload: 1
Exercise duration (min): 6 min
Exercise duration (sec): 50 s
MPHR: 149 {beats}/min
Peak HR: 78 {beats}/min
Percent HR: 52 %
Rest HR: 57 {beats}/min
ST Depression (mm): 0 mm

## 2024-06-06 LAB — CBC
HCT: 40 % (ref 39.0–52.0)
Hemoglobin: 13.5 g/dL (ref 13.0–17.0)
MCH: 32 pg (ref 26.0–34.0)
MCHC: 33.8 g/dL (ref 30.0–36.0)
MCV: 94.8 fL (ref 80.0–100.0)
Platelets: 162 K/uL (ref 150–400)
RBC: 4.22 MIL/uL (ref 4.22–5.81)
RDW: 11.9 % (ref 11.5–15.5)
WBC: 7.1 K/uL (ref 4.0–10.5)
nRBC: 0 % (ref 0.0–0.2)

## 2024-06-06 LAB — CREATININE, SERUM
Creatinine, Ser: 1.07 mg/dL (ref 0.61–1.24)
GFR, Estimated: >60 mL/min

## 2024-06-06 MED ORDER — AMLODIPINE BESYLATE 5 MG PO TABS
5.0000 mg | ORAL_TABLET | Freq: Two times a day (BID) | ORAL | Status: DC
  Administered 2024-06-06 – 2024-06-07 (×2): 5 mg via ORAL
  Filled 2024-06-06 (×2): qty 1

## 2024-06-06 MED ORDER — SODIUM CHLORIDE 0.9% FLUSH: 3.0000 mL | INTRAVENOUS | Status: DC | PRN

## 2024-06-06 MED ORDER — IOHEXOL 350 MG/ML SOLN
INTRAVENOUS | Status: DC | PRN
  Administered 2024-06-06: 70 mL

## 2024-06-06 MED ORDER — FREE WATER
500.0000 mL | Freq: Once | Status: AC
  Administered 2024-06-06: 500 mL via ORAL

## 2024-06-06 MED ORDER — HEPARIN (PORCINE) IN NACL 1000-0.9 UT/500ML-% IV SOLN
INTRAVENOUS | Status: DC | PRN
  Administered 2024-06-06: 1000 mL

## 2024-06-06 MED ORDER — HEPARIN SODIUM (PORCINE) 5000 UNIT/ML IJ SOLN: 5000.0000 [IU] | Freq: Three times a day (TID) | INTRAMUSCULAR | Status: DC

## 2024-06-06 MED ORDER — HYDRALAZINE HCL 20 MG/ML IJ SOLN
10.0000 mg | INTRAMUSCULAR | Status: AC | PRN
  Administered 2024-06-06: 10 mg via INTRAVENOUS
  Filled 2024-06-06: qty 1

## 2024-06-06 MED ORDER — LIDOCAINE HCL (PF) 1 % IJ SOLN
INTRAMUSCULAR | Status: AC
  Filled 2024-06-06: qty 30

## 2024-06-06 MED ORDER — FENTANYL CITRATE (PF) 100 MCG/2ML IJ SOLN
INTRAMUSCULAR | Status: AC
  Filled 2024-06-06: qty 2

## 2024-06-06 MED ORDER — SODIUM CHLORIDE 0.9 % IV SOLN: 250.0000 mL | INTRAVENOUS | Status: DC | PRN

## 2024-06-06 MED ORDER — ASPIRIN 81 MG PO CHEW: 81.0000 mg | CHEWABLE_TABLET | ORAL | Status: AC

## 2024-06-06 MED ORDER — SODIUM CHLORIDE 0.9% FLUSH
3.0000 mL | Freq: Two times a day (BID) | INTRAVENOUS | Status: DC
  Administered 2024-06-06 – 2024-06-07 (×3): 3 mL via INTRAVENOUS

## 2024-06-06 MED ORDER — FENTANYL CITRATE (PF) 100 MCG/2ML IJ SOLN
INTRAMUSCULAR | Status: DC | PRN
  Administered 2024-06-06: 25 ug via INTRAVENOUS

## 2024-06-06 MED ORDER — ENOXAPARIN SODIUM 40 MG/0.4ML IJ SOSY
40.0000 mg | PREFILLED_SYRINGE | Freq: Every day | INTRAMUSCULAR | Status: DC
  Administered 2024-06-06: 40 mg via SUBCUTANEOUS
  Filled 2024-06-06: qty 0.4

## 2024-06-06 MED ORDER — LIDOCAINE HCL (PF) 1 % IJ SOLN
INTRAMUSCULAR | Status: DC | PRN
  Administered 2024-06-06: 5 mL

## 2024-06-06 MED ORDER — MIDAZOLAM HCL 2 MG/2ML IJ SOLN
INTRAMUSCULAR | Status: AC
  Filled 2024-06-06: qty 2

## 2024-06-06 MED ORDER — MIDAZOLAM HCL (PF) 2 MG/2ML IJ SOLN
INTRAMUSCULAR | Status: DC | PRN
  Administered 2024-06-06: 2 mg via INTRAVENOUS

## 2024-06-06 NOTE — Interval H&P Note (Signed)
 History and Physical Interval Note:  06/06/2024 11:15 AM  Patrick Stout  has presented today for surgery, with the diagnosis of CP.  The various methods of treatment have been discussed with the patient and family. After consideration of risks, benefits and other options for treatment, the patient has consented to  Procedures: LEFT HEART CATH AND CORS/GRAFTS ANGIOGRAPHY (N/A) as a surgical intervention.  The patient's history has been reviewed, patient examined, no change in status, stable for surgery.  I have reviewed the patient's chart and labs.  Questions were answered to the patient's satisfaction.     [facility] 06/06/2024 11:15 AM Cath Lab Visit (complete for each Cath Lab visit)  Clinical Evaluation Leading to the Procedure:   ACS: Yes.    Non-ACS:    Anginal Classification: CCS IV  Anti-ischemic medical therapy: Minimal Therapy (1 class of medications)  Non-Invasive Test Results: Intermediate-risk stress test findings: cardiac mortality 1-3%/year  Prior CABG: Previous CABG

## 2024-06-06 NOTE — Progress Notes (Incomplete)
 "  Progress Note  Patient Name: Patrick Stout Date of Encounter: 06/06/2024 [facility] Cardiologist: Vinie JAYSON Maxcy, MD   Interval Summary   Kept overnight due to some CP while ambulating in the hall yesterday.  Antianginal meds adjusted with uptitration of Amlodipine .  Vital Signs Vitals:   06/06/24 1635 06/06/24 1808 06/06/24 1936 06/06/24 2120  BP: 139/75 (!) 140/75  (!) 140/75  Pulse: 64 65    Resp: 20  20   Temp: 98.3 F (36.8 C)     TempSrc: Oral     SpO2: 95%     Weight:      Height:        Intake/Output Summary (Last 24 hours) at 06/06/2024 2150 Last data filed at 06/06/2024 1843 Gross per 24 hour  Intake 1480 ml  Output 500 ml  Net 980 ml      06/04/2024    5:54 PM 05/29/2024    3:45 PM 11/01/2023    8:59 AM  Last 3 Weights  Weight (lbs) 181 lb 1.6 oz 183 lb 189 lb  Weight (kg) 82.146 kg 83.008 kg 85.73 kg      Telemetry/ECG  NSR- Personally Reviewed  Physical Exam  GEN: Well nourished, well developed in no acute distress HEENT: Normal NECK: No JVD; No carotid bruits LYMPHATICS: No lymphadenopathy CARDIAC:RRR, no murmurs, rubs, gallops RESPIRATORY:  Clear to auscultation without rales, wheezing or rhonchi  ABDOMEN: Soft, non-tender, non-distended MUSCULOSKELETAL:  No edema; No deformity  SKIN: Warm and dry NEUROLOGIC:  Alert and oriented x 3 PSYCHIATRIC:  Normal affect   Assessment & Plan  Patrick Stout is a 73 y.o. male with a history of CAD s/p CABG x5 (LIMA-LAD, RIMA-OM, SVG-D1, sequential SVG to PDA- right PLA) in 09/2009, hypertension, and hyperlipdiemia who presented to the ED 12/29 for  persistent hypertension, chest pressure, and palpitations. In the ED patient was hypertensive BP: 183/95 and in sinus rhythm HR 59. Negative troponin.  Cardiology consulted.   CAD -Pt presented with CP and DOE   CP was atypical by history    (pressure/stabbing, not associated with activity; trop neg in setting of severe pain) -Echo showed LVEF  and RVEF normal  No wall motion abnormalities -Myoview  done this admit showed LVEF 54% with inferior hypokinesis; small fixed defect in mid anterior wall; fixed defect in inferior wall; small mild reversible defect in apex and mid inferolateral wall. Suggestive of scar (inferior/inferolateral wall) with mild periinfarct ischemia in inferolateral base.   With echo showing normal wall motion, normal LVEF may also reflect some soft tissue attenuation  OVerall low risk -Anatomy from 2011  LHC showed 75% long  LAD stenosis; 80% prox D1, 99% mid LCx   80% RCA   Distal RCA dz.  LVEF 55%>>>Pt went on to have CABG in 2011 (LIMA to LAD; RIMA to OM; SVG to D1; SVG to PDA, R PLSA) -cath yesterday with severe 3v CAD; LIMA>>LAD patent, SVG>D1 occluded; RIMA>OM2 patent; SVG>PDA patent>>medical management recommended -had CP ambulating in hall after cath yesterday and antianginal therapy increased with amlodipine  doubled -continue ASA 81mg  daily, Carvedilol  12.5mg  BID, Amlodipine  5mg  BID  Palpitations Pt complained of palpitations in clinic visit prior to admission  Started 2 wks prior (Early Dec 2025) Here in hosp telemetry shows sinus bradycardia  EKG Shows SB 56  First degree AV block  PR 224 msec Zio monitor ordered outpatient and still pending arrival through the mail to home.   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 Continue to monitor outpatient.   For questions or updates, please contact [facility] Please consult [email or web address] for contact info under       Signed, Wilbert Bihari, MD  06/08/2024  "

## 2024-06-06 NOTE — H&P (View-Only) (Signed)
 "  Progress Note  Patient Name: Patrick Stout Date of Encounter: 06/06/2024 [facility] Cardiologist: Vinie JAYSON Maxcy, MD   Interval Summary   Today patient walked down hall and got some tightness in chest   Eased with rest  Vital Signs Vitals:   06/05/24 1921 06/05/24 2028 06/05/24 2235 06/06/24 0819  BP: (!) 177/90 136/79 124/82 (!) 152/83  Pulse: 66 71  62  Resp:  17 16 20   Temp:  98 F (36.7 C) 98.9 F (37.2 C) 97.6 F (36.4 C)  TempSrc:  Oral Oral Oral  SpO2:  100% 100% 96%  Weight:      Height:        Intake/Output Summary (Last 24 hours) at 06/06/2024 0831 Last data filed at 06/05/2024 2300 Gross per 24 hour  Intake 840 ml  Output --  Net 840 ml      06/04/2024    5:54 PM 05/29/2024    3:45 PM 11/01/2023    8:59 AM  Last 3 Weights  Weight (lbs) 181 lb 1.6 oz 183 lb 189 lb  Weight (kg) 82.146 kg 83.008 kg 85.73 kg      Telemetry/ECG  SB/SR - Personally Reviewed  Physical Exam  GEN: No acute distress.   Cardiac: regular rhythm, bradycardic rate, no murmurs, rubs, or gallops.  Respiratory: Clear to auscultation bilaterally. MS: No LE edema   Assessment & Plan  VAMSI APFEL is a 73 y.o. male with a history of CAD s/p CABG x5 (LIMA-LAD, RIMA-OM, SVG-D1, sequential SVG to PDA- right PLA) in 09/2009, hypertension, and hyperlipdiemia who presented to the ED 12/29 for  persistent hypertension, chest pressure, and palpitations. In the ED patient was hypertensive BP: 183/95 and in sinus rhythm HR 59. Negative troponin.  Cardiology consulted.   CAD  Pt presented with CP and DOE   CP was atypical by history    (pressure/stabbing, not associated with activity; trop neg in setting of severe pain) Echo showed LVEF and RVEF normal  No wall motion abnormalities Myoview  done yesterday   LVEF 54% with inferior hypokinesis; small fixed defect in mid anterior wall; fixed defect in inferior wall; small mild reversible defect in apex and mid inferolateral  wall.  I have reviewed images  Suggestive of scar (inferior/inferolateral wall) with mild periinfarct ischemia in inferolateral base.   With echo showing normal wall motion, normal LVEF may also reflect some soft tissue attenuation  OVerall low risk  Today pt had some pressure across chest and some SOB with walking     Given this I would recomm L heart cath to define anatomy    Risks / benefits described   Pt understands and agrees to proceed     Anatomy from 2011  LHC showed 75% long  LAD stenosis; 80% prox D1, 99% mid LCx   80% RCA   Distal RCA dz.  LVEF 55% Pt went on to have CABG in 2011 (LIMA to LAD; RIMA to OM; SVG to D1; SVG to PDA, R PLSA)   Continue ASA 81 mg Continue coreg  12.5 mg BID [did not tolerate higher doses 2/2 bradycardia/fatigue] Continue amlodipine  2.5 mg  Palpitations Pt complained of palpitations in clinic visit prior to admission  Started 2 wks prior (Early Dec 2025) Here in hosp telemetry shows sinus bradycardia  EKG Shows SB 56  First degree AV block  PR 224 msec Zio monitor ordered outpatient and still pending arrival through the mail to home.     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 Continue to monitor outpatient. Informed Consent   Shared Decision Making/Informed Consent The risks [stroke (1 in 1000), death (1 in 1000), kidney failure [usually temporary] (1 in 500), bleeding (1 in 200), allergic reaction [possibly serious] (1 in 200)], benefits (diagnostic support and management of coronary artery disease) and alternatives of a cardiac catheterization were discussed in detail with  Patrick Stout and he is willing to proceed.        For questions or updates, please contact [facility] Please consult [email or web address] for contact info under       Signed, Vina Gull, MD    "

## 2024-06-06 NOTE — Care Management Obs Status (Signed)
 MEDICARE OBSERVATION STATUS NOTIFICATION   Patient Details  Name: Patrick Stout MRN: [id number] Date of Birth: 07-16-1950   Medicare Observation Status Notification Given:  Yes    Vonzell Arrie Sharps 06/06/2024, 8:24 AM

## 2024-06-06 NOTE — Progress Notes (Signed)
"  ° °  L heart cath   06/06/24    Mid LM to Dist LM lesion is 35% stenosed.   Ost LAD to Prox LAD lesion is 90% stenosed.   2nd Mrg lesion is 100% stenosed.   Ost RCA to Mid RCA lesion is 100% stenosed.   Origin to Prox Graft lesion is 100% stenosed.   RPAV lesion is 100% stenosed.   graft was visualized by non-selective angiography and is normal in caliber.   LIMA graft was visualized by angiography and is normal in caliber.   SVG graft was visualized by angiography.   Seq SVG- PDA and PL graft was visualized by angiography.   The graft exhibits no disease.   The graft exhibits no disease.   LV end diastolic pressure is normal.   Severe 3 vessel obstructive CAD Patent LIMA to the LAD Occluded SVG to the first diagonal. The diagonal fills retrograde from the LIMA graft Patent RIMA to the OM Patent SVG to PDA but continuation of graft to PL is occluded. There are left to right collaterals to the PL Normal LVEDP 2 mm Hg   Plan; medical management  Came to review findings with patient   He had just had lunch   Laying flat in bed   Having some chest pressure    BP at time 170s/  Plan   Written for hydralazine  10 mg   Give to Rx Goal to get better control of BP which may help with collateral flow as he has  Will go up to 5 bid of amlodipine   Keep on ramipril  10 and carvedilol  12.5 bid  Signed, Vina Gull, MD  06/06/2024, 2:39 PM    "

## 2024-06-06 NOTE — Progress Notes (Addendum)
 "  Progress Note  Patient Name: Patrick Stout Date of Encounter: 06/06/2024 [facility] Cardiologist: Vinie JAYSON Maxcy, MD   Interval Summary   Today patient walked down hall and got some tightness in chest   Eased with rest  Vital Signs Vitals:   06/05/24 1921 06/05/24 2028 06/05/24 2235 06/06/24 0819  BP: (!) 177/90 136/79 124/82 (!) 152/83  Pulse: 66 71  62  Resp:  17 16 20   Temp:  98 F (36.7 C) 98.9 F (37.2 C) 97.6 F (36.4 C)  TempSrc:  Oral Oral Oral  SpO2:  100% 100% 96%  Weight:      Height:        Intake/Output Summary (Last 24 hours) at 06/06/2024 0831 Last data filed at 06/05/2024 2300 Gross per 24 hour  Intake 840 ml  Output --  Net 840 ml      06/04/2024    5:54 PM 05/29/2024    3:45 PM 11/01/2023    8:59 AM  Last 3 Weights  Weight (lbs) 181 lb 1.6 oz 183 lb 189 lb  Weight (kg) 82.146 kg 83.008 kg 85.73 kg      Telemetry/ECG  SB/SR - Personally Reviewed  Physical Exam  GEN: No acute distress.   Cardiac: regular rhythm, bradycardic rate, no murmurs, rubs, or gallops.  Respiratory: Clear to auscultation bilaterally. MS: No LE edema   Assessment & Plan  VAMSI APFEL is a 73 y.o. male with a history of CAD s/p CABG x5 (LIMA-LAD, RIMA-OM, SVG-D1, sequential SVG to PDA- right PLA) in 09/2009, hypertension, and hyperlipdiemia who presented to the ED 12/29 for  persistent hypertension, chest pressure, and palpitations. In the ED patient was hypertensive BP: 183/95 and in sinus rhythm HR 59. Negative troponin.  Cardiology consulted.   CAD  Pt presented with CP and DOE   CP was atypical by history    (pressure/stabbing, not associated with activity; trop neg in setting of severe pain) Echo showed LVEF and RVEF normal  No wall motion abnormalities Myoview  done yesterday   LVEF 54% with inferior hypokinesis; small fixed defect in mid anterior wall; fixed defect in inferior wall; small mild reversible defect in apex and mid inferolateral  wall.  I have reviewed images  Suggestive of scar (inferior/inferolateral wall) with mild periinfarct ischemia in inferolateral base.   With echo showing normal wall motion, normal LVEF may also reflect some soft tissue attenuation  OVerall low risk  Today pt had some pressure across chest and some SOB with walking     Given this I would recomm L heart cath to define anatomy    Risks / benefits described   Pt understands and agrees to proceed     Anatomy from 2011  LHC showed 75% long  LAD stenosis; 80% prox D1, 99% mid LCx   80% RCA   Distal RCA dz.  LVEF 55% Pt went on to have CABG in 2011 (LIMA to LAD; RIMA to OM; SVG to D1; SVG to PDA, R PLSA)   Continue ASA 81 mg Continue coreg  12.5 mg BID [did not tolerate higher doses 2/2 bradycardia/fatigue] Continue amlodipine  2.5 mg  Palpitations Pt complained of palpitations in clinic visit prior to admission  Started 2 wks prior (Early Dec 2025) Here in hosp telemetry shows sinus bradycardia  EKG Shows SB 56  First degree AV block  PR 224 msec Zio monitor ordered outpatient and still pending arrival through the mail to home.     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 Continue to monitor outpatient. Informed Consent   Shared Decision Making/Informed Consent The risks [stroke (1 in 1000), death (1 in 1000), kidney failure [usually temporary] (1 in 500), bleeding (1 in 200), allergic reaction [possibly serious] (1 in 200)], benefits (diagnostic support and management of coronary artery disease) and alternatives of a cardiac catheterization were discussed in detail with  Patrick Stout and he is willing to proceed.        For questions or updates, please contact [facility] Please consult [email or web address] for contact info under       Signed, Vina Gull, MD    "

## 2024-06-06 NOTE — Progress Notes (Signed)
" °  Progress Note   Patient: Patrick Stout FMW:[id number] DOB: 1950/11/26 DOA: 06/04/2024     0 DOS: the patient was seen and examined on 06/06/2024        Brief hospital course: 73 y.o. M with CAD s/p CABG 2011, HTN who presented with new severe range hypertension in the last 3 months, associated with palpitations and chest pain.      Assessment and Plan: *Chronic stable angina Admitted in underwent echo that was unremarkable, then underwent nuclear medicine stress which was low risk, showed inducible ischemia in the area was old CABG.  Today underwent left heart cath that showed severe three-vessel disease.  Pressures normal, no intervenable targets. - Defer antianginals to cardiology  Coronary artery disease involving coronary bypass graft of native heart without angina pectoris - Aspirin  81 mg daily  Hypertensive urgency Blood pressure mostly well-controlled. - Continue new amlodipine  and carvedilol  - Hydralazine  per cardiology - Continue Ramipril    Dyslipidemia - Continue rosuvastatin           Subjective: No clinical change     Physical Exam: BP (!) 173/76   Pulse 60   Temp 98.2 F (36.8 C) (Oral)   Resp 16   Ht 5' 8 (1.727 m)   Wt (P) 82.1 kg   SpO2 98%   BMI (P) 27.54 kg/m   Adult male, sitting up in bed, interactive and appropriate, later walking the hall briskly RRR, no murmurs, no peripheral edema Respiratory rate normal, lungs clear without rales or wheezes Attention normal, moves all extremities with normal strength and coordination, gait normal   Data Reviewed: Discussed with cardiology Telemetry reviewed, no arrhythmias   Family Communication: Girlfriend at the bedside    Disposition: Home when cardiology workup complete         Author: Lonni SHAUNNA Dalton, MD 06/06/2024 4:24 PM  For on call review [email or web address].    "

## 2024-06-06 NOTE — Interval H&P Note (Signed)
 History and Physical Interval Note:  06/06/2024 11:14 AM  Patrick Stout  has presented today for surgery, with the diagnosis of CP.  The various methods of treatment have been discussed with the patient and family. After consideration of risks, benefits and other options for treatment, the patient has consented to  Procedures: LEFT HEART CATH AND CORS/GRAFTS ANGIOGRAPHY (N/A) as a surgical intervention.  The patient's history has been reviewed, patient examined, no change in status, stable for surgery.  I have reviewed the patient's chart and labs.  Questions were answered to the patient's satisfaction.   Cath Lab Visit (complete for each Cath Lab visit)  Clinical Evaluation Leading to the Procedure:   ACS: Yes.    Non-ACS:    Anginal Classification: CCS IV  Anti-ischemic medical therapy: Minimal Therapy (1 class of medications)  Non-Invasive Test Results: No non-invasive testing performed  Prior CABG: Previous CABG        [facility] 06/06/2024 11:14 AM

## 2024-06-07 ENCOUNTER — Encounter (HOSPITAL_COMMUNITY): Payer: Self-pay | Admitting: Cardiology

## 2024-06-07 DIAGNOSIS — R079 Chest pain, unspecified: Secondary | ICD-10-CM | POA: Diagnosis not present

## 2024-06-07 DIAGNOSIS — I2510 Atherosclerotic heart disease of native coronary artery without angina pectoris: Secondary | ICD-10-CM

## 2024-06-07 DIAGNOSIS — I2583 Coronary atherosclerosis due to lipid rich plaque: Secondary | ICD-10-CM

## 2024-06-07 DIAGNOSIS — R002 Palpitations: Secondary | ICD-10-CM

## 2024-06-07 DIAGNOSIS — E7800 Pure hypercholesterolemia, unspecified: Secondary | ICD-10-CM

## 2024-06-07 MED ORDER — NITROGLYCERIN 0.4 MG SL SUBL: 0.4000 mg | SUBLINGUAL_TABLET | SUBLINGUAL | 2 refills | Status: AC | PRN

## 2024-06-07 MED ORDER — CARVEDILOL 12.5 MG PO TABS: 12.5000 mg | ORAL_TABLET | Freq: Two times a day (BID) | ORAL | 0 refills | Status: DC

## 2024-06-07 MED ORDER — AMLODIPINE BESYLATE 5 MG PO TABS: 5.0000 mg | ORAL_TABLET | Freq: Two times a day (BID) | ORAL | 0 refills | Status: DC

## 2024-06-07 MED ORDER — RAMIPRIL 10 MG PO CAPS: 10.0000 mg | ORAL_CAPSULE | Freq: Two times a day (BID) | ORAL | 0 refills | Status: DC

## 2024-06-07 NOTE — Discharge Summary (Signed)
 " Physician Discharge Summary   Patient: Patrick Stout MRN: [id number] DOB: [birth year]  Admit date:     06/04/2024  Discharge date: 06/07/2024  Discharge Physician: Lonni SHAUNNA Dalton   PCP: [facility]     Recommendations at discharge:  Follow up with Cardiology in 2 weeks Zio patch pending     Discharge Diagnoses: Principal Problem:   Chronic stable angina Active Problems:     Severe range hypertension   Coronary artery disease involving coronary bypass graft of native heart without angina pectoris   Dyslipidemia   Coronary artery disease due to lipid rich plaque   Pure hypercholesterolemia   Palpitations       Hospital Course: 74 y.o. M with CAD s/p CABG in 2011, HTN presented with worsening hypertension over the last few months, now new palpitations, generalized malaise, and chest pain.     Chronic stable angina Admitted and underwent echo that was unremarkable.  Follow-up nuclear medicine stress showed no high risk inducible ischemia, but given ongoing pain, went for left heart catheter that showed progression of three-vessel disease, no intervenable targets.  Cardiology titrated antihypertensives and antianginals, blood pressure remained well-controlled, symptoms overall improved.  Patient able to jog in the hallway today.  Discharged on new: amlodipine  5 mg twice daily Ramipril  10 mg twice daily Carvedilol  12.5 mg twice daily  Has aspirin  and rosuvastatin .  Given new prescription for nitroglycerin .  Has close follow-up with cardiology pending.         The [place]  Controlled Substances Registry was reviewed for this patient prior to discharge.  Consultants: Cardiology Procedures performed: Echocardiogram Nuclear medicine stress test Left heart cath  Disposition: Home Diet recommendation:  Cardiac diet  DISCHARGE MEDICATION: Allergies as of 06/07/2024   No Known Allergies      Medication List     STOP taking  these medications    metoprolol  succinate 50 MG 24 hr tablet Commonly known as: TOPROL -XL       TAKE these medications    amLODipine  5 MG tablet Commonly known as: NORVASC  Take 1 tablet (5 mg total) by mouth 2 (two) times daily.   aspirin  81 MG chewable tablet Chew 1 tablet (81 mg total) by mouth daily.   carvedilol  12.5 MG tablet Commonly known as: COREG  Take 1 tablet (12.5 mg total) by mouth 2 (two) times daily with a meal. What changed:  medication strength how much to take   EVENING PRIMROSE OIL PO Take 1 tablet by mouth 2 (two) times daily.   Fish Oil 1000 MG Caps Take 1,000 mg by mouth in the morning and at bedtime.   Flaxseed Oil 1000 MG Caps Take 2 capsules by mouth 2 (two) times daily.   folic acid  1 MG tablet Commonly known as: FOLVITE  TAKE 1 TABLET BY MOUTH EVERY DAY   multivitamin with minerals Tabs tablet Take 1 tablet by mouth daily.   nitroGLYCERIN  0.4 MG SL tablet Commonly known as: Nitrostat  Place 1 tablet (0.4 mg total) under the tongue every 5 (five) minutes as needed for chest pain (Max dose 3, if still having chest pain call 911).   ramipril  10 MG capsule Commonly known as: ALTACE  Take 1 capsule (10 mg total) by mouth 2 (two) times daily. What changed: how much to take   rosuvastatin  40 MG tablet Commonly known as: Crestor  Take 1 tablet (40 mg total) by mouth daily.        Follow-up Information     Hilty,  Vinie BROCKS, MD Follow up.   Specialty: Cardiology Contact information: [address] [place] [address] [phone]                 Discharge Instructions     Discharge instructions   Complete by: As directed    You were admitted for high blood pressure, chest pain and palpitations  Here, we found that your chest pain was exacerbated by high blood pressure spikes  Your echocardiogram showed good heart squeeze, which is reassuring  Your heart cath showed stable but advanced atherosclerosis (plaque  buildup) in multiple arteries. At present the cardiology team recommends medical treatment  Stop metoprolol  and change to carvedilol  12.5 mg twice daily  Start amlodipine  5 mg twice daily  Continue ramipril  10 mg twice daily  Go see Dr. Mona  For new chest pain, use nitroglycerin  under the tongue   Increase activity slowly   Complete by: As directed        Discharge Exam: Filed Weights   06/04/24 1754  Weight: (P) 82.1 kg    General: Pt is alert, awake, not in acute distress Cardiovascular: RRR, nl S1-S2, no murmurs appreciated.   No LE edema.   Respiratory: Normal respiratory rate and rhythm.  CTAB without rales or wheezes. Abdominal: Abdomen soft and non-tender.  No distension or HSM.   Neuro/Psych: Strength symmetric in upper and lower extremities.  Judgment and insight appear normal.   Condition at discharge: fair  The results of significant diagnostics from this hospitalization (including imaging, microbiology, ancillary and laboratory) are listed below for reference.   Imaging Studies: CARDIAC CATHETERIZATION Result Date: 06/06/2024   Mid LM to Dist LM lesion is 35% stenosed.   Ost LAD to Prox LAD lesion is 90% stenosed.   2nd Mrg lesion is 100% stenosed.   Ost RCA to Mid RCA lesion is 100% stenosed.   Origin to Prox Graft lesion is 100% stenosed.   RPAV lesion is 100% stenosed.   graft was visualized by non-selective angiography and is normal in caliber.   LIMA graft was visualized by angiography and is normal in caliber.   SVG graft was visualized by angiography.   Seq SVG- PDA and PL graft was visualized by angiography.   The graft exhibits no disease.   The graft exhibits no disease.   LV end diastolic pressure is normal. Severe 3 vessel obstructive CAD Patent LIMA to the LAD Occluded SVG to the first diagonal. The diagonal fills retrograde from the LIMA graft Patent RIMA to the OM Patent SVG to PDA but continuation of graft to PL is occluded. There are left to right  collaterals to the PL Normal LVEDP 2 mm Hg Plan; medical management   NM Myocar Multi W/Spect W/Wall Motion / EF Result Date: 06/06/2024 EXAM: MYOCARDIAL PERFUSION IMAGING _study_datetime_ RADIOPHARMACEUTICAL: Rest dose: 10.6 mCi Tc-49m Myoview  Stress dose: 30.7 mCi Tc-12m Myoview  TECHNIQUE: Under cardiology supervision, 0.4 mg Lexiscan  was infused intravenously prior to intravenous injection of the stress dose. SPECT imaging was acquired following the intravenous injection of the Myoview . ECG gating was obtained following the stress acquisition. COMPARISON: None available. CLINICAL HISTORY: FINDINGS: Perfusion: Image quality is good with no significant attenuation artifact. There is a small fixed defect involving the mid segment of the anterior wall. There is a fixed defect involving the apical, mid, and basilar segments of the inferior wall. A small, mild, reversible defect is noted within the apical segment of the lateral wall and mid segment of the inferolateral  wall. Summed stress score (SSS): 10 Summed rest score [facility]): 6 Summed reversibility score (SDS): 4 Scores are visually adjusted for potential artifact. TID score: 0.92 (Threshold value of 1.39 is used for Lexiscan  stress with Tc-23m) Function: End diastolic volume: 886 mL Left ventricular ejection fraction: 54% Wall motion abnormalities: Mild hypokinesis involving the inferior septum. End systolic volume: 53 mL IMPRESSION: 1. Small, mild, reversible defect in the apical lateral and mid inferolateral wall, consistent with inducible ischemia. 2. Myocardial perfusion risk stratification is moderate. 3. Small fixed defect in the mid anterior wall, consistent with prior infarct or scar. 4. Fixed defect in the apical, mid, and basilar inferior wall, consistent with prior infarct or scar. 5. Mild hypokinesis of the inferior septum. 6. Left ventricular ejection fraction of 54%. Electronically signed by: Waddell Calk MD 06/06/2024 05:37 AM EST RP  Workstation: [id number]   ECHOCARDIOGRAM COMPLETE Result Date: 06/04/2024    ECHOCARDIOGRAM REPORT   Patient Name:   KERRY CHISOLM Date of Exam: 06/04/2024 Medical Rec #:  [id number]      Height:       68.0 in Accession #:    [id number]     Weight:       183.0 lb Date of Birth:  1950-08-25      BSA:          1.968 m Patient Age:    73 years       BP:           157/79 mmHg Patient Gender: M              HR:           55 bpm. Exam Location:  Inpatient Procedure: 2D Echo, Cardiac Doppler, Color Doppler and Intracardiac            Opacification Agent (Both Spectral and Color Flow Doppler were            utilized during procedure). Indications:    Chest pain  History:        Patient has no prior history of Echocardiogram examinations.                 CAD, Prior CABG; Risk Factors:Dyslipidemia and Hypertension.  Sonographer:    Meagan Baucom RDCS, FE, PE Referring Phys: [id number] PAULA V ROSS IMPRESSIONS  1. Left ventricular ejection fraction, by estimation, is 55 to 60%. The left ventricle has normal function. The left ventricle has no regional wall motion abnormalities. Left ventricular diastolic parameters are consistent with Grade I diastolic dysfunction (impaired relaxation).  2. Right ventricular systolic function is normal. The right ventricular size is normal.  3. Left atrial size was mildly dilated.  4. The mitral valve is normal in structure. No evidence of mitral valve regurgitation. No evidence of mitral stenosis.  5. The aortic valve is tricuspid. Aortic valve regurgitation is mild to moderate. Aortic valve sclerosis is present, with no evidence of aortic valve stenosis.  6. There is mild dilatation of the ascending aorta, measuring 41 mm.  7. The inferior vena cava is normal in size with greater than 50% respiratory variability, suggesting right atrial pressure of 3 mmHg. FINDINGS  Left Ventricle: Left ventricular ejection fraction, by estimation, is 55 to 60%. The left ventricle has normal function. The left  ventricle has no regional wall motion abnormalities. Definity  contrast agent was given IV to delineate the left ventricular  endocardial borders. The left ventricular internal cavity size was normal in size. There is no left ventricular  hypertrophy. Left ventricular diastolic parameters are consistent with Grade I diastolic dysfunction (impaired relaxation). Right Ventricle: The right ventricular size is normal. No increase in right ventricular wall thickness. Right ventricular systolic function is normal. Left Atrium: Left atrial size was mildly dilated. Right Atrium: Right atrial size was normal in size. Pericardium: There is no evidence of pericardial effusion. Mitral Valve: The mitral valve is normal in structure. No evidence of mitral valve regurgitation. No evidence of mitral valve stenosis. Tricuspid Valve: The tricuspid valve is normal in structure. Tricuspid valve regurgitation is mild . No evidence of tricuspid stenosis. Aortic Valve: The aortic valve is tricuspid. Aortic valve regurgitation is mild to moderate. Aortic valve sclerosis is present, with no evidence of aortic valve stenosis. Pulmonic Valve: The pulmonic valve was normal in structure. Pulmonic valve regurgitation is mild. No evidence of pulmonic stenosis. Aorta: The aortic root is normal in size and structure. There is mild dilatation of the ascending aorta, measuring 41 mm. Venous: The inferior vena cava is normal in size with greater than 50% respiratory variability, suggesting right atrial pressure of 3 mmHg. IAS/Shunts: No atrial level shunt detected by color flow Doppler.  LEFT VENTRICLE PLAX 2D LVIDd:         5.10 cm   Diastology LVIDs:         3.20 cm   LV e' medial:    5.55 cm/s LV PW:         1.10 cm   LV E/e' medial:  10.0 LV IVS:        1.20 cm   LV e' lateral:   5.87 cm/s LVOT diam:     2.40 cm   LV E/e' lateral: 9.5 LV SV:         122 LV SV Index:   62 LVOT Area:     4.52 cm  RIGHT VENTRICLE RV S prime:     11.60 cm/s TAPSE  (M-mode): 1.6 cm LEFT ATRIUM             Index        RIGHT ATRIUM           Index LA diam:        3.60 cm 1.83 cm/m   RA Area:     19.80 cm LA Vol (A2C):   52.3 ml 26.57 ml/m  RA Volume:   48.00 ml  24.39 ml/m LA Vol (A4C):   63.0 ml 32.01 ml/m LA Biplane Vol: 60.3 ml 30.64 ml/m  AORTIC VALVE LVOT Vmax:   124.00 cm/s LVOT Vmean:  74.400 cm/s LVOT VTI:    0.269 m  AORTA Ao Root diam: 4.20 cm Ao Asc diam:  4.00 cm MITRAL VALVE MV Area (PHT): 2.07 cm    SHUNTS MV Decel Time: 366 msec    Systemic VTI:  0.27 m MV E velocity: 55.50 cm/s  Systemic Diam: 2.40 cm MV A velocity: 77.60 cm/s MV E/A ratio:  0.72 Morene Brownie Electronically signed by Morene Brownie Signature Date/Time: 06/04/2024/5:38:25 PM    Final    DG Chest 2 View Result Date: 06/04/2024 EXAM: PA AND LATERAL (2) VIEW(S) XRAY OF THE CHEST 06/04/2024 02:53:00 AM COMPARISON: Recent PA and Lateral 06/02/2024. CLINICAL HISTORY: chest pain FINDINGS: LUNGS AND PLEURA: The lungs are slightly emphysematous. No focal pulmonary opacity. No pleural effusion. No pneumothorax. HEART AND MEDIASTINUM: The heart is slightly enlarged. Sternotomy and CABG changes. 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 BONES AND SOFT TISSUES: Osteopenia and thoracic spondylosis. IMPRESSION: 1.  No acute findings. 2. Stable COPD chest. Electronically signed by: Francis Quam MD 06/04/2024 03:00 AM EST RP Workstation: [id number]   DG Chest 2 View Result Date: 06/02/2024 CLINICAL DATA:  Left-sided chest tightness. EXAM: CHEST - 2 VIEW COMPARISON:  11/10/2009 FINDINGS: The lungs are clear without focal pneumonia, edema, pneumothorax or pleural effusion. Cardiopericardial silhouette is at upper limits of normal for size. No acute bony abnormality. IMPRESSION: No active cardiopulmonary disease. Electronically Signed   By: Camellia Candle M.D.   On: 06/02/2024 13:51    Microbiology: Results for orders placed or performed during the hospital encounter of  08/06/16  Urine culture     Status: None   Collection Time: 08/06/16  6:30 AM   Specimen: Urine, Random  Result Value Ref Range Status   Specimen Description URINE, RANDOM  Final   Special Requests NONE  Final   Culture NO GROWTH  Final   Report Status 08/07/2016 FINAL  Final    Labs: CBC: Recent Labs  Lab 06/02/24 1217 06/04/24 0235 06/06/24 1404  WBC 7.3 7.0 7.1  HGB 14.4 14.4 13.5  HCT 42.5 41.9 40.0  MCV 95.5 95.2 94.8  PLT 193 192 162   Basic Metabolic Panel: Recent Labs  Lab 06/02/24 1217 06/04/24 0235 06/04/24 1940 06/06/24 1404  NA 141 140  --   --   K 4.5 4.3  --   --   CL 105 102  --   --   CO2 28 28  --   --   GLUCOSE 101* 108*  --   --   BUN 12 20  --   --   CREATININE 1.07 1.12 1.10 1.07  CALCIUM  9.8 9.6  --   --    Liver Function Tests: No results for input(s): AST, ALT, ALKPHOS, BILITOT, PROT, ALBUMIN in the last 168 hours. CBG: Recent Labs  Lab 06/02/24 1525  GLUCAP 101*    Discharge time spent: approximately 22 minutes spent on discharge counseling, evaluation of patient on day of discharge, and coordination of discharge planning with nursing, social work, pharmacy and case management  Signed: Lonni SHAUNNA Dalton, MD [facility] 06/07/2024         "

## 2024-06-07 NOTE — Plan of Care (Signed)
  Problem: Education: Goal: Understanding of cardiac disease, CV risk reduction, and recovery process will improve Outcome: Progressing Goal: Individualized Educational Video(s) Outcome: Progressing   Problem: Activity: Goal: Ability to tolerate increased activity will improve Outcome: Progressing   Problem: Cardiac: Goal: Ability to achieve and maintain adequate cardiovascular perfusion will improve Outcome: Progressing   Problem: Education: Goal: Knowledge of General Education information will improve Description: Including pain rating scale, medication(s)/side effects and non-pharmacologic comfort measures Outcome: Progressing

## 2024-06-07 NOTE — Progress Notes (Signed)
 Minor chest pressure at the beginning of the night. Moderate groin pain over the night. No chest pressure, pain or tightness with minimal ambulation.

## 2024-06-08 LAB — LIPOPROTEIN A (LPA): Lipoprotein (a): 213.3 nmol/L — ABNORMAL HIGH

## 2024-06-12 ENCOUNTER — Encounter (HOSPITAL_COMMUNITY): Payer: Self-pay

## 2024-06-12 ENCOUNTER — Emergency Department (HOSPITAL_COMMUNITY)

## 2024-06-12 ENCOUNTER — Other Ambulatory Visit: Payer: Self-pay

## 2024-06-12 ENCOUNTER — Emergency Department (HOSPITAL_COMMUNITY)
Admission: EM | Admit: 2024-06-12 | Discharge: 2024-06-12 | Disposition: A | Discharge status: Home / Self Care | Attending: Emergency Medicine | Admitting: Emergency Medicine

## 2024-06-12 DIAGNOSIS — I25118 Atherosclerotic heart disease of native coronary artery with other forms of angina pectoris: Secondary | ICD-10-CM

## 2024-06-12 DIAGNOSIS — I10 Essential (primary) hypertension: Secondary | ICD-10-CM | POA: Diagnosis not present

## 2024-06-12 DIAGNOSIS — Z7982 Long term (current) use of aspirin: Secondary | ICD-10-CM | POA: Insufficient documentation

## 2024-06-12 DIAGNOSIS — I2510 Atherosclerotic heart disease of native coronary artery without angina pectoris: Secondary | ICD-10-CM | POA: Diagnosis not present

## 2024-06-12 DIAGNOSIS — Z79899 Other long term (current) drug therapy: Secondary | ICD-10-CM | POA: Diagnosis not present

## 2024-06-12 DIAGNOSIS — E785 Hyperlipidemia, unspecified: Secondary | ICD-10-CM | POA: Diagnosis not present

## 2024-06-12 DIAGNOSIS — R079 Chest pain, unspecified: Secondary | ICD-10-CM | POA: Diagnosis present

## 2024-06-12 DIAGNOSIS — R0789 Other chest pain: Secondary | ICD-10-CM | POA: Diagnosis not present

## 2024-06-12 DIAGNOSIS — R002 Palpitations: Secondary | ICD-10-CM | POA: Insufficient documentation

## 2024-06-12 LAB — CBC
HCT: 39.3 % (ref 39.0–52.0)
Hemoglobin: 13.4 g/dL (ref 13.0–17.0)
MCH: 32.6 pg (ref 26.0–34.0)
MCHC: 34.1 g/dL (ref 30.0–36.0)
MCV: 95.6 fL (ref 80.0–100.0)
Platelets: 178 K/uL (ref 150–400)
RBC: 4.11 MIL/uL — ABNORMAL LOW (ref 4.22–5.81)
RDW: 11.9 % (ref 11.5–15.5)
WBC: 8.6 K/uL (ref 4.0–10.5)
nRBC: 0 % (ref 0.0–0.2)

## 2024-06-12 LAB — BASIC METABOLIC PANEL WITH GFR
Anion gap: 9 (ref 5–15)
BUN: 19 mg/dL (ref 8–23)
CO2: 27 mmol/L (ref 22–32)
Calcium: 9.6 mg/dL (ref 8.9–10.3)
Chloride: 104 mmol/L (ref 98–111)
Creatinine, Ser: 1 mg/dL (ref 0.61–1.24)
GFR, Estimated: >60 mL/min
Glucose, Bld: 111 mg/dL — ABNORMAL HIGH (ref 70–99)
Potassium: 4.2 mmol/L (ref 3.5–5.1)
Sodium: 140 mmol/L (ref 135–145)

## 2024-06-12 LAB — TROPONIN T, HIGH SENSITIVITY
Troponin T High Sensitivity: <15 ng/L (ref 0–19)
Troponin T High Sensitivity: 15 ng/L (ref 0–19)

## 2024-06-12 MED ORDER — ISOSORBIDE MONONITRATE ER 30 MG PO TB24: 30.0000 mg | ORAL_TABLET | Freq: Every day | ORAL | 0 refills | Status: DC

## 2024-06-12 NOTE — Progress Notes (Signed)
 ON-CALL CARDIOLOGY 06/12/2024  Patient's name: Patrick Stout.   MRN: [id number].    DOB: [birth year] Primary care provider: Nanci Senior, MD. Primary cardiologist: Dr. Mona Locker regarding this patient's care today: Received a call from the ED physician extender Ms. Abigail to review the patient's case.  According to electronic medical record the patient presented to the ED for left-sided chest pain, radiating to the left neck, was given sublingual nitroglycerin  tablets as well as aspirin .  EKG status post arrival shows a sinus rhythm without ST changes to suggest ACS.  High sensitive troponins are negative x 2.  Patient underwent echocardiogram, stress test, and left heart catheterization all since June 04, 2024.  Echocardiogram notes preserved LVEF.  Left heart catheterization noted native disease, patent LIMA to the LAD, patent RIMA to OM, occluded SVG to first diagonal branch but collaterals present, patent SVG to PDA but PLB graft occluded with presence of collaterals.  Currently denies anginal chest pain.  Has intermittent fluttering sensation in the chest which are lasting for 5 to 10 minutes.  Currently wearing a Zio patch.  ED APP called for further recommendations.  Vital signs, EKG, labs from 06/12/2024 reviewed. Reviewed relevant cardiovascular workup: Echocardiogram results 06/04/2024, stress test results 06/06/2024, heart catheterization results from 06/06/2024.  Impression: Coronary artery disease with prior surgical revascularization with stable angina Dyslipidemia. Hypertension  Recommendations: Patient with known coronary artery disease with prior surgical revascularization and a recent complete cardiovascular workup including echo, stress test, left heart catheterization less than 2 weeks ago.  Presents to the ED for chest pain evaluation.  High sensitive cardiac biomarkers are negative x 2 and EKG does not illustrate ACS.  Vital signs  reviewed blood pressures could be better controlled.  Recommend starting Imdur  30 mg p.o. daily if patient is not on PDE 5 inhibitors.  ED APP to discuss medication profile prior to administrating the prescription.  Complete the cardiac monitor.  Has an appointment with cardiology on June 29, 2024.  Come back to the ED if symptoms recur.  Telephone encounter total time: 9 minutes.   Madonna Michele HAS, [facility]  A Division of [facility] [address], [place], [place] [address]  06/12/2024 6:57 PM

## 2024-06-12 NOTE — Discharge Instructions (Addendum)
 Do not take Imdur  with any ED medication Follow-up with your cardiologist Stop taking the Imdur  if you develop significant low blood pressure and feelings like you might pass out  Get help right away if: You have chest pain or shortness of breath. You have a severe headache. You feel dizzy or you faint. These symptoms may represent a serious problem that is an emergency. Do not wait to see if the symptoms will go away. Get medical help right away. Call your local emergency services (911 in the [place] Do not drive yourself to the hospital.

## 2024-06-12 NOTE — ED Provider Notes (Signed)
 " [facility] Provider Note   CSN: [id number] Arrival date & time: 06/12/24  9675     Patient presents with: Chest Pain   Patrick Stout is a 74 y.o. male who presents with chest discomfort.  He had a recent hospitalization and underwent a cardiac catheterization that showed three-vessel disease with some collateral flow he had inducible ischemia with stress test.  There was no intervention that was available for his vessel disease and he is currently being managed medically.  He started on amlodipine  and carvedilol .  Patient takes aspirin  daily.  He presents stating I need an answer,  for chest discomfort.  Patient reports that he is feeling of fluttering around his heart with a knot on the left of his chest and pain that is described as squeezing that radiates across the lower anterior rib cage in the the distribution of his diaphragm.  He denies diaphoresis or shortness of breath.  Patient states that symptoms last 5 to 10 minutes.  He states he has not been able to sleep because of it.  He is currently way wearing a Zio patch.  Patient presents Alese a list of at least 40 blood pressure and pulse rate checks that he did personally overnight.  He denies unilateral leg swelling he denies pleuritic chest pain he denies any other symptoms.  Symptoms come and go.  He took nitroglycerin  3 times and it seemed to improve his discomfort for about 20 minutes.    Chest Pain      Prior to Admission medications  Medication Sig Start Date End Date Taking? Authorizing Provider  amLODipine  (NORVASC ) 5 MG tablet Take 1 tablet (5 mg total) by mouth 2 (two) times daily. 06/07/24   Danford, Lonni SQUIBB, MD  aspirin  81 MG chewable tablet Chew 1 tablet (81 mg total) by mouth daily. 08/08/18   Hilty, Vinie BROCKS, MD  carvedilol  (COREG ) 12.5 MG tablet Take 1 tablet (12.5 mg total) by mouth 2 (two) times daily with a meal. 06/07/24   Danford, Lonni SQUIBB, MD  EVENING  PRIMROSE OIL PO Take 1 tablet by mouth 2 (two) times daily.     [provider]  Flaxseed, Linseed, (FLAXSEED OIL) 1000 MG CAPS Take 2 capsules by mouth 2 (two) times daily.     [provider]  folic acid  (FOLVITE ) 1 MG tablet TAKE 1 TABLET BY MOUTH EVERY DAY 01/23/24   Hilty, Vinie BROCKS, MD  Multiple Vitamin (MULTIVITAMIN WITH MINERALS) TABS Take 1 tablet by mouth daily.    [provider]  nitroGLYCERIN  (NITROSTAT ) 0.4 MG SL tablet Place 1 tablet (0.4 mg total) under the tongue every 5 (five) minutes as needed for chest pain (Max dose 3, if still having chest pain call 911). 06/07/24   Danford, Lonni SQUIBB, MD  Omega-3 Fatty Acids (FISH OIL) 1000 MG CAPS Take 1,000 mg by mouth in the morning and at bedtime.    [provider]  ramipril  (ALTACE ) 10 MG capsule Take 1 capsule (10 mg total) by mouth 2 (two) times daily. 06/07/24   Danford, Lonni SQUIBB, MD  rosuvastatin  (CRESTOR ) 40 MG tablet Take 1 tablet (40 mg total) by mouth daily. 08/08/18   Hilty, Vinie BROCKS, MD    Allergies: Patient has no known allergies.    Review of Systems  Cardiovascular:  Positive for chest pain.    Updated Vital Signs BP (!) 182/92 (BP Location: Left Arm)   Pulse 60   Temp 97.7  F (36.5 C) (Oral)   Resp 20   Ht 5' 8 (1.727 m)   Wt 83 kg   SpO2 98%   BMI 27.82 kg/m   Physical Exam Vitals and nursing note reviewed.  Constitutional:      General: He is not in acute distress.    Appearance: He is well-developed. He is not diaphoretic.  HENT:     Head: Normocephalic and atraumatic.  Eyes:     General: No scleral icterus.    Conjunctiva/sclera: Conjunctivae normal.  Cardiovascular:     Rate and Rhythm: Normal rate and regular rhythm.     Heart sounds: Normal heart sounds.  Pulmonary:     Effort: Pulmonary effort is normal. No respiratory distress.     Breath sounds: Normal breath sounds.  Abdominal:     Palpations: Abdomen is soft.     Tenderness: There is no  abdominal tenderness.  Musculoskeletal:     Cervical back: Normal range of motion and neck supple.  Skin:    General: Skin is warm and dry.  Neurological:     Mental Status: He is alert.  Psychiatric:        Behavior: Behavior normal.     (all labs ordered are listed, but only abnormal results are displayed) Labs Reviewed  BASIC METABOLIC PANEL WITH GFR - Abnormal; Notable for the following components:      Result Value   Glucose, Bld 111 (*)    All other components within normal limits  CBC - Abnormal; Notable for the following components:   RBC 4.11 (*)    All other components within normal limits  TROPONIN T, HIGH SENSITIVITY  TROPONIN T, HIGH SENSITIVITY    EKG: EKG Interpretation Date/Time:  Tuesday June 12 2024 06:54:43 EST Ventricular Rate:  65 PR Interval:  196 QRS Duration:  94 QT Interval:  418 QTC Calculation: 434 R Axis:   81  Text Interpretation: Normal sinus rhythm Normal ECG When compared with ECG of 12-Jun-2024 03:40, PREVIOUS ECG IS PRESENT Confirmed by Cottie Cough [phone]) on 06/12/2024 7:27:05 AM  Radiology: [facility] Chest 2 View Result Date: 06/12/2024 CLINICAL DATA:  Chest pain EXAM: CHEST - 2 VIEW COMPARISON:  06/04/2024 FINDINGS: Lungs are hyperexpanded. The lungs are clear without focal pneumonia, edema, pneumothorax or pleural effusion. The cardiopericardial silhouette is within normal limits for size. No acute bony abnormality. IMPRESSION: No active cardiopulmonary disease. Electronically Signed   By: Camellia Candle M.D.   On: 06/12/2024 04:43     Procedures   Medications Ordered in the ED - No data to display  Clinical Course as of 06/12/24 1919  Tue Jun 12, 2024  1845 Imdur  30 [AH]    Clinical Course User Index [AH] Arloa Chroman, PA-C                                 Medical Decision Making Amount and/or Complexity of Data Reviewed Labs: ordered. Radiology: ordered.  Risk Prescription drug management.   This patient presents  to the ED for concern of chest discomfort and palpitations, this involves an extensive number of treatment options, and is a complaint that carries with it a high risk of complications and morbidity.   The differential diagnosis for palpitations includes cardiac arrhythmias, PVC/PAC, ACS, Cardiomyopathy, CHF, MVP, pericarditis, valvular disease, Panic/Anxiety, Somatic disorder, ETOH, Caffeine,  Stimulant use, medication side effect, Anemia, Hyperthyroidism, pulmonary embolism.   Co morbidities:   has  a past medical history of Coronary artery disease, Family history of heart disease, History of nuclear stress test (10/01/2009), Hypercholesterolemia, and Hypertension.    Social Determinants of Health:   SDOH Screenings   Food Insecurity: No Food Insecurity (06/04/2024)  Housing: Low Risk (06/04/2024)  Transportation Needs: No Transportation Needs (06/04/2024)  Utilities: Not At Risk (06/04/2024)  Social Connections: Socially Isolated (06/04/2024)  Tobacco Use: Low Risk (06/12/2024)     Additional history:  {Additional history obtained from wife Reviewed recent admission records including cath report, discharge summary, lets, echocardiogram.  Lab Tests:  I Ordered, and personally interpreted labs.  The pertinent results include:   Labs show no significant abnormalities, 2 negative troponins, glucose 111  Imaging Studies:  I ordered imaging studies including two-view chest x-ray I independently visualized and interpreted imaging which showed no acute finding I agree with the radiologist interpretation  Cardiac Monitoring/ECG:  The patient was maintained on a cardiac monitor.  I personally viewed and interpreted the cardiac monitored which showed an underlying rhythm of:   Normal sinus rhythm at a rate of 65      Medications I have reviewed the patients home medicines and have made adjustments as a recommendation of cardiology  Test Considered:    Critical  Interventions:    Consultations Obtained: Case discussed with Dr. Eddy recommends Imdur  30 mg daily if patient is not taking ED medications  Problem List / ED Course:     ICD-10-CM   1. Chest discomfort  R07.89       MDM: Patient here with chest discomfort relieved with nitroglycerin .  Hypertensive.  No acute findings.  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  Symptoms are very atypical for ACS or other cause of chest pain such as PE. At this time can add Imdur , discussed return precautions with the patient.  He is currently pain-free, no pleuritic chest pain or shortness of breath.   Dispostion:  After consideration of the diagnostic results and the patients response to treatment, I feel that the patent would benefit from discharge with close cardiology follow-up..      Final diagnoses:  None    ED Discharge Orders     None          Arloa Chroman, PA-C 06/12/24 2107    Franklyn Sid SAILOR, MD 06/12/24 2138  "

## 2024-06-12 NOTE — ED Provider Triage Note (Signed)
 Emergency Medicine Provider Triage Evaluation Note  Patrick Stout , a 74 y.o. male  was evaluated in triage.  Pt complains of intermittent CP for past 2 weeks. Was admitted 06/04/24-1/1//26 for chronic stable angina. Last LHC notable for Severe 3 vessel obstructive CAD. Last echo on 06/04/24 LVEF 55-60% with normal LV function and grade 1 diastolic  Today woke pt up from sleep. Took 2 ntg temporarily helped pain but returned. Had ASA 324mg  PTA. No shob. Pain currently 4-5/10  Review of Systems  Positive: See hpi Negative:   Physical Exam  BP 122/70 (BP Location: Right Arm)   Pulse (!) 57   Temp 98 F (36.7 C) (Oral)   Resp 16   Ht 5' 8 (1.727 m)   Wt 83 kg   SpO2 99%   BMI 27.82 kg/m  Gen:   Awake, no distress   Resp:  Normal effort  MSK:   Moves extremities without difficulty  Other:    Medical Decision Making  Medically screening exam initiated at 4:22 AM.  Appropriate orders placed.  Patrick Stout was informed that the remainder of the evaluation will be completed by another provider, this initial triage assessment does not replace that evaluation, and the importance of remaining in the ED until their evaluation is complete.  Cardiac workup started. EKG wo ischemic changes   Patrick Stout, Patrick Stout 06/12/24 [phone]

## 2024-06-12 NOTE — ED Triage Notes (Signed)
 Pt arrived from home via [facility] c/o chest pain left sided, substernal radiating to L neck. Originally 4/10 on pain scale described as tightness. Pt has significant cardiac hx. Pt took 1 SL nitroglycerin  at home then ems adm 3x SL nitroglycerin  enroute along with 324 ASA

## 2024-06-12 NOTE — ED Notes (Signed)
 Patient provided snack bag and drink by RN

## 2024-06-15 ENCOUNTER — Emergency Department (HOSPITAL_BASED_OUTPATIENT_CLINIC_OR_DEPARTMENT_OTHER)

## 2024-06-15 ENCOUNTER — Encounter (HOSPITAL_BASED_OUTPATIENT_CLINIC_OR_DEPARTMENT_OTHER): Payer: Self-pay | Admitting: Emergency Medicine

## 2024-06-15 ENCOUNTER — Emergency Department (HOSPITAL_BASED_OUTPATIENT_CLINIC_OR_DEPARTMENT_OTHER): Admitting: Radiology

## 2024-06-15 ENCOUNTER — Emergency Department (HOSPITAL_BASED_OUTPATIENT_CLINIC_OR_DEPARTMENT_OTHER)
Admission: EM | Admit: 2024-06-15 | Discharge: 2024-06-15 | Disposition: A | Discharge status: Home / Self Care | Attending: Emergency Medicine | Admitting: Emergency Medicine

## 2024-06-15 DIAGNOSIS — I10 Essential (primary) hypertension: Secondary | ICD-10-CM | POA: Diagnosis not present

## 2024-06-15 DIAGNOSIS — R079 Chest pain, unspecified: Secondary | ICD-10-CM | POA: Diagnosis not present

## 2024-06-15 DIAGNOSIS — S8011XA Contusion of right lower leg, initial encounter: Secondary | ICD-10-CM | POA: Insufficient documentation

## 2024-06-15 DIAGNOSIS — I2510 Atherosclerotic heart disease of native coronary artery without angina pectoris: Secondary | ICD-10-CM | POA: Insufficient documentation

## 2024-06-15 DIAGNOSIS — S8991XA Unspecified injury of right lower leg, initial encounter: Secondary | ICD-10-CM | POA: Diagnosis present

## 2024-06-15 DIAGNOSIS — R0600 Dyspnea, unspecified: Secondary | ICD-10-CM | POA: Insufficient documentation

## 2024-06-15 DIAGNOSIS — Z79899 Other long term (current) drug therapy: Secondary | ICD-10-CM | POA: Diagnosis not present

## 2024-06-15 DIAGNOSIS — S75001A Unspecified injury of femoral artery, right leg, initial encounter: Secondary | ICD-10-CM | POA: Diagnosis not present

## 2024-06-15 DIAGNOSIS — Z7982 Long term (current) use of aspirin: Secondary | ICD-10-CM | POA: Diagnosis not present

## 2024-06-15 DIAGNOSIS — R0602 Shortness of breath: Secondary | ICD-10-CM | POA: Diagnosis not present

## 2024-06-15 DIAGNOSIS — Z955 Presence of coronary angioplasty implant and graft: Secondary | ICD-10-CM | POA: Insufficient documentation

## 2024-06-15 DIAGNOSIS — X58XXXA Exposure to other specified factors, initial encounter: Secondary | ICD-10-CM | POA: Insufficient documentation

## 2024-06-15 LAB — CBC
HCT: 38.1 % — ABNORMAL LOW (ref 39.0–52.0)
Hemoglobin: 13.1 g/dL (ref 13.0–17.0)
MCH: 32 pg (ref 26.0–34.0)
MCHC: 34.4 g/dL (ref 30.0–36.0)
MCV: 93.2 fL (ref 80.0–100.0)
Platelets: 201 K/uL (ref 150–400)
RBC: 4.09 MIL/uL — ABNORMAL LOW (ref 4.22–5.81)
RDW: 11.9 % (ref 11.5–15.5)
WBC: 7.6 K/uL (ref 4.0–10.5)
nRBC: 0 % (ref 0.0–0.2)

## 2024-06-15 LAB — BASIC METABOLIC PANEL WITH GFR
Anion gap: 10 (ref 5–15)
BUN: 15 mg/dL (ref 8–23)
CO2: 27 mmol/L (ref 22–32)
Calcium: 9.9 mg/dL (ref 8.9–10.3)
Chloride: 104 mmol/L (ref 98–111)
Creatinine, Ser: 0.98 mg/dL (ref 0.61–1.24)
GFR, Estimated: >60 mL/min
Glucose, Bld: 99 mg/dL (ref 70–99)
Potassium: 4.1 mmol/L (ref 3.5–5.1)
Sodium: 141 mmol/L (ref 135–145)

## 2024-06-15 LAB — PROTIME-INR
INR: 0.9 (ref 0.8–1.2)
Prothrombin Time: 12.4 s (ref 11.4–15.2)

## 2024-06-15 LAB — PRO BRAIN NATRIURETIC PEPTIDE: Pro Brain Natriuretic Peptide: 145 pg/mL

## 2024-06-15 LAB — TROPONIN T, HIGH SENSITIVITY
Troponin T High Sensitivity: <15 ng/L (ref 0–19)
Troponin T High Sensitivity: <15 ng/L (ref 0–19)

## 2024-06-15 MED ORDER — LORAZEPAM 2 MG/ML IJ SOLN
1.0000 mg | INTRAMUSCULAR | Status: DC | PRN
  Administered 2024-06-15: 1 mg via INTRAVENOUS
  Filled 2024-06-15: qty 1

## 2024-06-15 MED ORDER — IOHEXOL 350 MG/ML SOLN
100.0000 mL | Freq: Once | INTRAVENOUS | Status: AC | PRN
  Administered 2024-06-15: 75 mL via INTRAVENOUS

## 2024-06-15 MED ORDER — IOHEXOL 350 MG/ML SOLN
75.0000 mL | Freq: Once | INTRAVENOUS | Status: AC | PRN
  Administered 2024-06-15: 75 mL via INTRAVENOUS

## 2024-06-15 NOTE — Discharge Instructions (Signed)
 It was a pleasure taking care of you today. You were seen in the Emergency Department for evaluation of right thigh pain and bruising. Your work-up was reassuring. Your CT/Xray/Labs showed no evidence of blood clot in your leg or your lungs.  There is evidence of a small hematoma/bruise in the area where you had the heart cath on 12/31.  No evidence of pseudoaneurysm, dissection or evidence of active bleeding.  Follow-up with your PCP and cardiologist as scheduled.  Please return to the ER if you experience chest pain, trouble breathing, intractable nausea/vomiting or any other life threatening illnesses.

## 2024-06-15 NOTE — ED Provider Notes (Signed)
 " [place] EMERGENCY DEPARTMENT AT [facility] Provider Note   CSN: [id number] Arrival date & time: 06/15/24  1529     Patient presents with: Leg Swelling   Patrick Stout is a 74 y.o. male with past medical history of hypertension, coronary artery disease, hypercholesteremia, status post CABG x 5 from 2011, and recent hospitalization where he underwent a cardiac catheterization on 12/31 that showed three-vessel disease with some collateral flow and inducible ischemia via stress test, who presents the emergency department for evaluation of unilateral leg swelling.  Patient reports that he was in the shower today, when he noticed significant bruising to his right lower extremity as well as tenderness and swelling.  He also is endorsing some chest discomfort as well as shortness of breath with dyspnea.  He denies any shortness of breath at rest.  Additionally, patient was recently seen in the emergency department on 1/6 for a chest pain workup that was ultimately negative.  At that time, he denied unilateral leg swelling and stated that nitroglycerin  did help to improve his discomfort.   HPI     Prior to Admission medications  Medication Sig Start Date End Date Taking? Authorizing Provider  amLODipine  (NORVASC ) 5 MG tablet Take 1 tablet (5 mg total) by mouth 2 (two) times daily. 06/07/24   Danford, Lonni SQUIBB, MD  aspirin  81 MG chewable tablet Chew 1 tablet (81 mg total) by mouth daily. 08/08/18   Hilty, Vinie BROCKS, MD  carvedilol  (COREG ) 12.5 MG tablet Take 1 tablet (12.5 mg total) by mouth 2 (two) times daily with a meal. 06/07/24   Danford, Lonni SQUIBB, MD  EVENING PRIMROSE OIL PO Take 1 tablet by mouth 2 (two) times daily.     [provider]  Flaxseed, Linseed, (FLAXSEED OIL) 1000 MG CAPS Take 2 capsules by mouth 2 (two) times daily.     [provider]  folic acid  (FOLVITE ) 1 MG tablet TAKE 1 TABLET BY MOUTH EVERY DAY 01/23/24   Hilty, Vinie BROCKS, MD  isosorbide   mononitrate (IMDUR ) 30 MG 24 hr tablet Take 1 tablet (30 mg total) by mouth daily. 06/12/24   Harris, Abigail, PA-C  Multiple Vitamin (MULTIVITAMIN WITH MINERALS) TABS Take 1 tablet by mouth daily.    [provider]  nitroGLYCERIN  (NITROSTAT ) 0.4 MG SL tablet Place 1 tablet (0.4 mg total) under the tongue every 5 (five) minutes as needed for chest pain (Max dose 3, if still having chest pain call 911). 06/07/24   Danford, Lonni SQUIBB, MD  Omega-3 Fatty Acids (FISH OIL) 1000 MG CAPS Take 1,000 mg by mouth in the morning and at bedtime.    [provider]  ramipril  (ALTACE ) 10 MG capsule Take 1 capsule (10 mg total) by mouth 2 (two) times daily. 06/07/24   Danford, Lonni SQUIBB, MD  rosuvastatin  (CRESTOR ) 40 MG tablet Take 1 tablet (40 mg total) by mouth daily. 08/08/18   Hilty, Vinie BROCKS, MD    Allergies: Patient has no known allergies.    Review of Systems  Cardiovascular:  Positive for chest pain and leg swelling.    Updated Vital Signs BP 137/85   Pulse 69   Temp 98.1 F (36.7 C) (Oral)   Resp 18   SpO2 99%   Physical Exam Vitals and nursing note reviewed.  Constitutional:      Appearance: Normal appearance.  HENT:     Head: Normocephalic and atraumatic.     Mouth/Throat:     Mouth: Mucous membranes are  moist.  Eyes:     General: No scleral icterus.       Right eye: No discharge.        Left eye: No discharge.     Conjunctiva/sclera: Conjunctivae normal.  Cardiovascular:     Rate and Rhythm: Normal rate and regular rhythm.     Pulses: Normal pulses.  Pulmonary:     Effort: Pulmonary effort is normal.     Breath sounds: Normal breath sounds.  Abdominal:     General: There is no distension.     Tenderness: There is no abdominal tenderness.  Musculoskeletal:        General: No deformity.     Cervical back: Normal range of motion.     Right lower leg: Edema present.     Comments: Swelling noted to right lower extremity.  DP pulses are palpable bilaterally.   There is extensive bruising noted to the right lower extremity above the knee, on the medial side of the thigh.  Incision site does not look infected.  Skin:    General: Skin is warm and dry.     Capillary Refill: Capillary refill takes less than 2 seconds.  Neurological:     Mental Status: He is alert.     Motor: No weakness.  Psychiatric:        Mood and Affect: Mood normal.     (all labs ordered are listed, but only abnormal results are displayed) Labs Reviewed  CBC - Abnormal; Notable for the following components:      Result Value   RBC 4.09 (*)    HCT 38.1 (*)    All other components within normal limits  BASIC METABOLIC PANEL WITH GFR  PRO BRAIN NATRIURETIC PEPTIDE  PROTIME-INR  TROPONIN T, HIGH SENSITIVITY  TROPONIN T, HIGH SENSITIVITY    EKG: EKG Interpretation Date/Time:  Friday June 15 2024 15:45:58 EST Ventricular Rate:  70 PR Interval:  196 QRS Duration:  98 QT Interval:  394 QTC Calculation: 425 R Axis:   63  Text Interpretation: Normal sinus rhythm Incomplete right bundle branch block Borderline ECG When compared with ECG of 12-Jun-2024 06:54, Incomplete right bundle branch block is now Present No significant change since last tracing Confirmed by Dasie Faden ([id number]) on 06/15/2024 7:07:23 PM  Radiology: CT ANGIO LOWER EXT BILAT W &/OR WO CONTRAST Result Date: 06/15/2024 EXAM: CTA BILATERAL LOWER EXTREMITY 06/15/2024 07:25:41 PM TECHNIQUE: Contrast-enhanced computed tomography angiography of the lower extremity was performed with multiplanar reconstructions. Maximum intensity projection images were created on a separate workstation and reviewed. Automated exposure control, iterative reconstruction, and/or weight based adjustment of the mA/kV was utilized to reduce the radiation dose to as low as reasonably achievable. 75 mL of iohexol  (OMNIPAQUE ) 350 MG/ML injection was administered. COMPARISON: None available. CLINICAL HISTORY: thrombosed pseudoaneurysm or  postprocedural hematoma on RLE on DVT study thrombosed pseudoaneurysm or postprocedural hematoma on RLE on DVT study FINDINGS: ARTERIAL: Extensive aortoiliac atherosclerotic calcification. No aortic aneurysm or dissection. 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 No hemodynamically significant stenosis involving the lower extremity arterial inflow and outflow bilaterally. Lower extremity runoff distal to the popliteal arteries is excluded from this examination. LEFT COMMON ILIAC ARTERY: No hemodynamically significant stenosis or vessel occlusion. LEFT EXTERNAL ILIAC ARTERY: No hemodynamically significant stenosis or vessel occlusion. COMMON FEMORAL ARTERY: Right: Moderate perivascular hematoma involving the right common femoral artery. No pseudoaneurysm, dissection, or active extravasation identified. No hemodynamically significant stenosis or vessel occlusion. Left: No hemodynamically significant stenosis  or vessel occlusion. SUPERFICIAL FEMORAL ARTERY: No hemodynamically significant stenosis or vessel occlusion. POPLITEAL ARTERY: Widely patent. No hemodynamically significant stenosis or vessel occlusion. TIBIOPERONEAL TRUNK: Lower extremity runoff distal to the popliteal arteries is excluded from this examination. ANTERIOR TIBIAL ARTERY: Lower extremity runoff distal to the popliteal arteries is excluded from this examination. PERONEAL ARTERY: Lower extremity runoff distal to the popliteal arteries is excluded from this examination. POSTERIOR TIBIAL ARTERY: Lower extremity runoff distal to the popliteal arteries is excluded from this examination. BONES AND SOFT TISSUES: Surgical changes of possible transurethral resection involving the prostate gland with small contained bladder base leak at the juncture of the prostatic urethra. No extraprostatic extension of the leaked contrast. Moderate perivascular hematoma involving the right common femoral artery. No pseudoaneurysm, dissection, or active extravasation  identified. No significant osseous abnormalities seen within the field of view. IMPRESSION: 1. Moderate perivascular hematoma involving the right common femoral artery, without pseudoaneurysm, dissection, or active extravasation. 2. No hemodynamically significant stenosis involving the lower extremity arterial inflow and outflow bilaterally; evaluation distal to the popliteal arteries is excluded, and the popliteal arteries are widely patent. 3. Postsurgical changes of probable transurethral resection of the prostate with a small contained bladder base leak at the prostatic urethra, without extraprostatic extension of leaked contrast. Electronically signed by: Dorethia Molt MD MD 06/15/2024 07:47 PM EST RP Workstation: [id number]   CT Angio Chest PE W and/or Wo Contrast Result Date: 06/15/2024 EXAM: CTA of the Chest with contrast for PE 06/15/2024 05:50:26 PM TECHNIQUE: CTA of the chest was performed after the administration of 75 mL of iohexol  (OMNIPAQUE ) 350 MG/ML injection. Multiplanar reformatted images are provided for review. MIP images are provided for review. Automated exposure control, iterative reconstruction, and/or weight based adjustment of the mA/kV was utilized to reduce the radiation dose to as low as reasonably achievable. COMPARISON: None available. CLINICAL HISTORY: shob, recent surgery, unilateral leg swelling FINDINGS: PULMONARY ARTERIES: Pulmonary arteries are adequately opacified for evaluation. No pulmonary embolism. Central pulmonary arteries are of normal caliber. MEDIASTINUM: Coronary artery bypass grafting has been performed. Global cardiac size within normal limits. No pericardial effusion. Mild atherosclerotic calcification within the thoracic aorta. No aortic aneurysm. Small hiatal hernia. LYMPH NODES: No mediastinal, hilar or axillary lymphadenopathy. LUNGS AND PLEURA: The lungs are without acute process. No focal consolidation or pulmonary edema. No pleural effusion or  pneumothorax. UPPER ABDOMEN: Cholelithiasis. Numerous nonobstructing renal calculi are seen within the visualized upper pole of the kidneys bilaterally. SOFT TISSUES AND BONES: No acute bone or soft tissue abnormality. IMPRESSION: 1. No pulmonary embolism. 2. Status post coronary artery bypass grafting. 3. Small hiatal hernia. 4. Cholelithiasis. 5. Numerous nonobstructing renal calculi in the visualized upper poles of both kidneys. Electronically signed by: Dorethia Molt MD MD 06/15/2024 06:16 PM EST RP Workstation: [id number]   US  Venous Img Lower Right (DVT Study) Result Date: 06/15/2024 CLINICAL DATA:  Right thigh bruising and swelling EXAM: RIGHT LOWER EXTREMITY VENOUS DOPPLER ULTRASOUND TECHNIQUE: Gray-scale sonography with compression, as well as color and duplex ultrasound, were performed to evaluate the deep venous system(s) from the level of the common femoral vein through the popliteal and proximal calf veins. COMPARISON:  09/02/2020 FINDINGS: VENOUS Normal compressibility of the common femoral, superficial femoral, and popliteal veins, as well as the visualized calf veins. Visualized portions of profunda femoral vein and great saphenous vein unremarkable. No filling defects to suggest DVT on grayscale or color Doppler imaging. Doppler waveforms show normal direction of venous flow, normal respiratory plasticity and  response to augmentation. Limited views of the contralateral common femoral vein are unremarkable. OTHER There is a heterogeneous 6.3 x 2.3 by 2.7 cm fluid collection anterior to the right common femoral artery, with no internal color flow or communication with the underlying femoral vessels. This likely reflects residual hematoma or thrombosed pseudoaneurysm after recent heart catheterization procedure. Limitations: none IMPRESSION: 1. No evidence of deep venous thrombosis within the right lower extremity. 2. Complex fluid collection within the right inguinal region, adjacent to the common  femoral artery and vein, most consistent with thrombosed pseudoaneurysm or postprocedural hematoma after recent catheterization procedure. No internal vascular flow or connection with the underlying vessels. Electronically Signed   By: Ozell Daring M.D.   On: 06/15/2024 18:03   DG Chest 2 View Result Date: 06/15/2024 CLINICAL DATA:  Chest pain EXAM: CHEST - 2 VIEW COMPARISON:  06/12/2024 FINDINGS: Frontal and lateral views of the chest demonstrate postsurgical changes from prior CABG. Cardiac silhouette is stable. No acute airspace disease, effusion, or pneumothorax. No acute bony abnormalities. IMPRESSION: 1. Stable chest, no acute process. Electronically Signed   By: Ozell Daring M.D.   On: 06/15/2024 16:22     Procedures   Medications Ordered in the ED  LORazepam  (ATIVAN ) injection 1 mg (1 mg Intravenous Given 06/15/24 1718)  iohexol  (OMNIPAQUE ) 350 MG/ML injection 75 mL (75 mLs Intravenous Contrast Given 06/15/24 1739)  iohexol  (OMNIPAQUE ) 350 MG/ML injection 100 mL (75 mLs Intravenous Contrast Given 06/15/24 1925)                                 Medical Decision Making Amount and/or Complexity of Data Reviewed Labs: ordered. Radiology: ordered.  Risk Prescription drug management.   This patient presents to the ED for concern of unilateral leg swelling, this involves an extensive number of treatment options, and is a complaint that carries with it a high risk of complications and morbidity.   Differential diagnosis includes: DVT, PE, cellulitis, vascular injury, pitting edema secondary to heart failure exacerbation  Co morbidities:  CAD, status post CABG from 2011  Additional history:  Patient was seen in the emergency department for evaluation of chest pain on 1/6.  He was ultimately discharged with a unremarkable workup.  He also did undergo a cardiac cath on 12/31.  Please see HPI above for further detail  Lab Tests:  I Ordered, and personally interpreted labs.  The pertinent  results include: No acute abnormality or evidence of active bleeding  Imaging Studies:  I ordered imaging studies including chest x-ray, DVT study, CTA PE rule out, CTA lower extremity I independently visualized and interpreted imaging which showed no evidence of DVT, PE or cardiopulmonary abnormality.  However, DVT study does show thrombosed pseudoaneurysm versus postprocedural hematoma.  CTA lower extremity with evidence of moderate perivascular hematoma involving the right common femoral artery.  No evidence of pseudoaneurysm, dissection or active extravasation.  I agree with the radiologist interpretation  Cardiac Monitoring/ECG:  The patient was maintained on a cardiac monitor.  I personally viewed and interpreted the cardiac monitored which showed an underlying rhythm of: Normal sinus rhythm  Medicines ordered and prescription drug management:  I ordered medication including  Medications  LORazepam  (ATIVAN ) injection 1 mg (1 mg Intravenous Given 06/15/24 1718)  iohexol  (OMNIPAQUE ) 350 MG/ML injection 75 mL (75 mLs Intravenous Contrast Given 06/15/24 1739)  iohexol  (OMNIPAQUE ) 350 MG/ML injection 100 mL (75 mLs Intravenous Contrast Given 06/15/24  1925)   for anxiety with CT scan Reevaluation of the patient after these medicines showed that the patient improved I have reviewed the patients home medicines and have made adjustments as needed  Test Considered:   none  Critical Interventions:   none  Consultations Obtained: None  Problem List / ED Course:     ICD-10-CM   1. Injury of right common femoral artery, initial encounter  S75.001A       MDM: 74 year old male who presents emergency department for evaluation of unilateral leg swelling.  Patient noticed the swelling last night.  He also is endorsing intermittent chest pain and shortness of breath.  Moderate suspicion for DVT and/or PE as patient recently underwent a surgical procedure and endorses shortness of breath.   However, patient's vital signs are reassuring as his heart rate is normal.  I did order both a DVT study as well as CT PE study to rule out any type of blood clot.  Additional lab work shows no acute abnormality that would account for patient's symptoms.  However, his DVT study does show evidence of a complex fluid collection in the right inguinal region, most consistent with a thrombosed pseudoaneurysm or postprocedural hematoma.  I have a low suspicion for active bleed, as patient is not on blood thinners and only takes daily ASA.  I did order a CTA of his lower extremities to rule out vascular injury.  Patient's BNP is within normal limits, so low suspicion for heart failure exacerbation.  Unlikely to be cellulitis as patient does not have any redness, tenderness, wound or leukocytosis.  Pending results of CTA of his lower extremity, patient will go home and follow-up with cardiology.  Upon follow-up, no evidence of active bleeding found in CTA of patient's lower extremity.  He does have a follow-up with his PCP next week and cardiology at the end of January.  I recommended he keep these appointments.  I suggested patient take Tylenol  as needed for pain control.  He did verbalize understanding to this.  Patient vital signs are stable.  Patient is appropriate for discharge at this time.  Dispostion:  After consideration of the diagnostic results and the patients response to treatment, I feel that the patient would benefit from supportive care.   Final diagnoses:  Injury of right common femoral artery, initial encounter    ED Discharge Orders     None          Torrence Marry RAMAN, PA-C 06/15/24 2100  "

## 2024-06-15 NOTE — ED Triage Notes (Signed)
 Femoral cath entry on 12/31. Noticed today bruising down thigh and swelling of entire leg- right. Left leg also has some swelling. ASA daily. +CP, +SOB intermittent- before and after cath.

## 2024-06-20 DIAGNOSIS — R002 Palpitations: Secondary | ICD-10-CM

## 2024-06-21 ENCOUNTER — Ambulatory Visit: Payer: Self-pay | Admitting: Emergency Medicine

## 2024-06-27 ENCOUNTER — Encounter: Payer: Self-pay | Admitting: *Deleted

## 2024-06-29 ENCOUNTER — Encounter: Payer: Self-pay | Admitting: Emergency Medicine

## 2024-06-29 ENCOUNTER — Ambulatory Visit: Attending: Emergency Medicine | Admitting: Emergency Medicine

## 2024-06-29 VITALS — BP 147/75 | HR 61 | Resp 17 | Ht 68.0 in | Wt 183.0 lb

## 2024-06-29 DIAGNOSIS — I25810 Atherosclerosis of coronary artery bypass graft(s) without angina pectoris: Secondary | ICD-10-CM | POA: Diagnosis not present

## 2024-06-29 DIAGNOSIS — I471 Supraventricular tachycardia, unspecified: Secondary | ICD-10-CM | POA: Diagnosis not present

## 2024-06-29 DIAGNOSIS — I351 Nonrheumatic aortic (valve) insufficiency: Secondary | ICD-10-CM | POA: Diagnosis not present

## 2024-06-29 DIAGNOSIS — Z951 Presence of aortocoronary bypass graft: Secondary | ICD-10-CM

## 2024-06-29 DIAGNOSIS — I10 Essential (primary) hypertension: Secondary | ICD-10-CM | POA: Diagnosis not present

## 2024-06-29 DIAGNOSIS — E785 Hyperlipidemia, unspecified: Secondary | ICD-10-CM

## 2024-06-29 MED ORDER — AMLODIPINE BESYLATE 5 MG PO TABS: 5.0000 mg | ORAL_TABLET | Freq: Two times a day (BID) | ORAL | 5 refills | Status: AC

## 2024-06-29 MED ORDER — RAMIPRIL 10 MG PO CAPS: 10.0000 mg | ORAL_CAPSULE | Freq: Two times a day (BID) | ORAL | 5 refills | Status: AC

## 2024-06-29 MED ORDER — ISOSORBIDE MONONITRATE ER 30 MG PO TB24: 30.0000 mg | ORAL_TABLET | Freq: Every day | ORAL | 3 refills | Status: AC

## 2024-06-29 NOTE — Patient Instructions (Addendum)
 Medication Instructions:  START TAKING ISOSORBIDE  MONONITRATE (IMDUR ) 30 MG DAILY.  Lab Work: NONE TO BE DONE TODAY.  Testing/Procedures: NONE  Follow-Up: At [facility], you and your health needs are our priority.  As part of our continuing mission to provide you with exceptional heart care, our providers are all part of one team.  This team includes your primary Cardiologist (physician) and Advanced Practice Providers or APPs (Physician Assistants and Nurse Practitioners) who all work together to provide you with the care you need, when you need it.  Your next appointment:   3 MONTHS  Provider:   Vinie JAYSON Maxcy, MD     Other Instructions:

## 2024-06-29 NOTE — Progress Notes (Signed)
 " Cardiology Office Note:    Date:  06/29/2024  ID:  Patrick Stout, Patrick Stout [birth year], MRN [id number] PCP: Nanci Senior, MD  [facility] Providers Cardiologist:  Vinie JAYSON Maxcy, MD Cardiology APP:  Rana Lum CROME, NP       Patient Profile:       Chief Complaint: Hospital follow-up History of Present Illness:  Patrick Stout is a 74 y.o. male with visit-pertinent history of CAD s/p CABG, hyperlipidemia, and hypertension   status post bypass surgery with LIMA to LAD, RIMA to the OM and SVG to D1, SVG to PDA, and sequential SVG to PDA in April of 2011. 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 Based on that, we are aggressively treating his cholesterol.  He underwent exercise Myoview  in 2016 that was without ischemia or infarction.   Seen on 11/01/2023 by Dr. Maxcy.  He was doing well now 14 years post bypass surgery.  He was without anginal symptoms.  His cholesterol and blood pressures were at goal.  He was to follow-up in 1 year.   Patient called into nurse triage line on 05/28/2024.  Noted his blood pressure been elevated for a few months.  Saw his PCP on 12/10 and PCP increase his meds related to systolic blood pressures in the 200s.  Increase ramipril  to 10 mg daily and increase metoprolol  to tartrate to 50 mg twice daily.  Patient also was noted to have new onset palpitations.  He was made an office visit for further evaluation.  Last seen in clinic on 05/29/2024.  His blood pressure was elevated 154/78.  His metoprolol  was discontinued and he was started on carvedilol  25 mg twice daily.  He was continued on ramipril  10 mg twice daily.  He reported palpitations described as frequent skipped beats.  He was seen in the ED on 06/02/2024 for hypertension and blood pressure as high as 200.  His high-sensitivity troponin  was negative.  He was given IV hydralazine  with improvement in his blood pressure and discharge.  He return to the ED on 06/04/2024 with elevated blood pressure readings and chest pains.  His troponins were negative.  He was admitted and cardiology was consulted for further evaluation.  He reported he did not tolerate carvedilol  25 mg twice daily due to fatigue and bradycardia so he restarted his metoprolol .  He was started on amlodipine  2.5 mg and transition back to carvedilol  but at 12.5 mg twice daily.  Echocardiogram showed normal LVEF and no wall motion abnormalities.  He underwent Myoview  showing LVEF 54% with inferior hypokinesis; small fixed defect in mid anterior wall; fixed defect in inferior wall; small mild reversible defect in apex and mid inferolateral wall.  He also underwent cardiac catheterization showing severe three-vessel CAD; LIMA>LAD patent, SVG>D1 occluded; RIMA>OM2 patent; SVG>PDA patent>>medical management recommended.  His amlodipine  was increased to 5 mg twice daily for further antianginal benefit.  He was discharged in stable condition.  He presented back to the hospital 06/12/2024 with complaints of chest discomfort.  His high-sensitivity troponins were negative x 2.  He was started on Imdur  30 mg daily.   Discussed the use of AI scribe software for clinical note transcription with the patient, who gave verbal consent to proceed.  History of Present Illness Since his recent hospital discharge he has had persistent chest tightness under the  sternum, sometimes with a fluttering sensation radiating to his neck and shoulder. Symptoms occur with minimal exertion such as walking to the end of his driveway. Nitroglycerin  relieves the pain.  He will intermittently have some difficulty sleeping as laying flat has caused him to experience chest discomfort.  He has been doing much heavy lifting or high aerobic activity since December due to the symptoms.  He currently takes carvedilol ,  isosorbide , amlodipine , aspirin , ramipril , and simvastatin, but he has not been taking isosorbide  because he is worried about low blood pressure into the low hundreds and heart rate in the fifties, which make him feel uncomfortable.  He wanted discuss Imdur  today.  He wants to return to his normal activity level but remains limited by intermittent, unpredictable episodes of chest pain and shortness of breath that interfere with daily activities.  He denies syncope, presyncope, lightheadedness, dizziness, melena, hematochezia  Review of systems:  Please see the history of present illness. All other systems are reviewed and otherwise negative.      Studies Reviewed:        ZIO 05/29/2024 Patch Wear Time:  8 days and 17 hours (2026-01-01T14:59:43-0500 to 2026-01-10T08:29:49-0500)   Patient had a min HR of 50 bpm, max HR of 141 bpm, and avg HR of 64 bpm. Predominant underlying rhythm was Sinus Rhythm. 9 Supraventricular Tachycardia runs occurred, the run with the fastest interval lasting 6 beats with a max rate of 141 bpm, the  longest lasting 8 beats with an avg rate of 102 bpm. Some episodes of Supraventricular Tachycardia may be possible Atrial Tachycardia with variable block. Isolated SVEs were rare (<1.0%), SVE Couplets were rare (<1.0%), and SVE Triplets were rare  (<1.0%). Isolated VEs were rare (<1.0%, 2931), VE Couplets were rare (<1.0%, 44), and VE Triplets were rare (<1.0%, 4). Ventricular Trigeminy was present.   Sinus rhythm with 9 runs of PSVT, up to 8 beats. No afib or other significant abnormal rhythms noted. Symptom diary did not correlate with arrhythmias.  Cardiac catheterization 06/06/2024   Mid LM to Dist LM lesion is 35% stenosed.   Ost LAD to Prox LAD lesion is 90% stenosed.   2nd Mrg lesion is 100% stenosed.   Ost RCA to Mid RCA lesion is 100% stenosed.   Origin to Prox Graft lesion is 100% stenosed.   RPAV lesion is 100% stenosed.   graft was visualized by  non-selective angiography and is normal in caliber.   LIMA graft was visualized by angiography and is normal in caliber.   SVG graft was visualized by angiography.   Seq SVG- PDA and PL graft was visualized by angiography.   The graft exhibits no disease.   The graft exhibits no disease.   LV end diastolic pressure is normal.   Severe 3 vessel obstructive CAD Patent LIMA to the LAD Occluded SVG to the first diagonal. The diagonal fills retrograde from the LIMA graft Patent RIMA to the OM Patent SVG to PDA but continuation of graft to PL is occluded. There are left to right collaterals to the PL Normal LVEDP 2 mm Hg   Plan; medical management Diagnostic Dominance: Right   Echocardiogram 06/04/2024  1. Left ventricular ejection fraction, by estimation, is 55 to 60%. The  left ventricle has normal function. The left ventricle has no regional  wall motion abnormalities. Left ventricular diastolic parameters are  consistent with Grade I diastolic  dysfunction (impaired relaxation).   2. Right ventricular systolic function is normal. The right ventricular  size  is normal.   3. Left atrial size was mildly dilated.   4. The mitral valve is normal in structure. No evidence of mitral valve  regurgitation. No evidence of mitral stenosis.   5. The aortic valve is tricuspid. Aortic valve regurgitation is mild to  moderate. Aortic valve sclerosis is present, with no evidence of aortic  valve stenosis.   6. There is mild dilatation of the ascending aorta, measuring 41 mm.   7. The inferior vena cava is normal in size with greater than 50%  respiratory variability, suggesting right atrial pressure of 3 mmHg.   Exercise Myoview  05/06/2015 Nuclear stress EF: 56%. The left ventricular ejection fraction is normal (55-65%). Blood pressure demonstrated a hypertensive response to exercise. There was no ST segment deviation noted during stress. The study is normal.   Normal stress nuclear study  with no ischemia or infarction; EF 56 with normal wall motion.  Risk Assessment/Calculations:          Physical Exam:   VS:  BP (!) 147/75 (BP Location: Left Arm, Patient Position: Sitting, Cuff Size: Normal)   Pulse 61   Resp 17   Ht 5' 8 (1.727 m)   Wt 183 lb (83 kg)   SpO2 97%   BMI 27.83 kg/m    Wt Readings from Last 3 Encounters:  06/29/24 183 lb (83 kg)  06/12/24 182 lb 15.7 oz (83 kg)  06/04/24 (P) 181 lb 1.6 oz (82.1 kg)    GEN: Well nourished, well developed in no acute distress NECK: No JVD; No carotid bruits CARDIAC: RRR, no murmurs, rubs, gallops RESPIRATORY:  Clear to auscultation without rales, wheezing or rhonchi  ABDOMEN: Soft, non-tender, non-distended EXTREMITIES:  No edema; No acute deformity      Assessment and Plan:  Hypertension Blood pressure today is 147/75 Blood pressures at home have varied but have remained relatively well-controlled with no significant hypertension or hypotension.  Does take his blood pressure multiple times daily.  Experienced hypotension on carvedilol  25 mg twice daily in the past - Average blood pressure is more on the higher side - Start isosorbide  30 mg daily - Continue ramipril  10 mg twice daily - Continue carvedilol  12.5 mg twice daily - Continue amlodipine  5 mg twice daily   Palpitations SVT Zio 06/2024 showed 9 runs of PSVT with the longest lasting 8 beats with an average rate of 102 bpm.  No atrial fibrillation or other significant abnormal rhythms - He does continue to experience palpitations which she describes as flutters.  His recent ZIO was reassuring and had no arrhythmias found during recent admission or ED visit.  Unable to further titrate carvedilol  given pulse of 61 bpm - No changes to current regimen - Continue carvedilol  12.5 mg twice a day - We discussed triggers for palpitations   Coronary artery disease S/p CABG with LIMA to LAD, RIMA to the OM and SVG to D1, SVG to PDA, and sequential SVG to PDA in  April of 2011  Most recent cath 05/2024 showed severe 3v CAD; LIMA>>LAD patent, SVG>D1 occluded; RIMA>OM2 patent; SVG>PDA patent>>medical management recommended  Echo 05/2024 with LVEF 55 to 60%, no RWMA, grade 1 DD, RV normal - Groin site healing appropriately without signs of infection or hematoma - Today patient continues to experience chest discomfort as well as dyspnea with no significant changes in symptoms since his recent hospitalization.  Continues to experience exertional chest discomfort - I will plan to further titrate antianginal therapy as no interventions were warranted  during most recent cath - Start isosorbide  30 mg daily - Continue carvedilol  12.5 mg twice daily (did not tolerate higher doses 2/2 bradycardia/fatigue) - Continue amlodipine  5 mg twice daily - Continue aspirin  81 mg daily and rosuvastatin  40 mg daily   Hyperlipidemia Lipoprotein (A) 213.3 on 06/2024 LDL 60 on 05/2024 - Continue rosuvastatin  40 mg   Aortic valve regurgitation Echocardiogram 05/2024 showed mild to moderate aortic valve regurgitation - Will repeat echocardiogram x 1 year for routine monitoring - No further interventions warranted at this time  Aortic dilation Echocardiogram 05/2024 showed mild dilation of ascending aorta at 41 mm - Will repeat echocardiogram 1 year for routine monitoring      Dispo:  Return in about 3 months (around 09/27/2024).  Signed, Lum LITTIE Louis, NP  "

## 2024-07-05 ENCOUNTER — Telehealth: Payer: Self-pay | Admitting: Internal Medicine

## 2024-07-05 MED ORDER — CARVEDILOL 12.5 MG PO TABS: 12.5000 mg | ORAL_TABLET | Freq: Two times a day (BID) | ORAL | 3 refills | Status: AC

## 2024-07-05 NOTE — Telephone Encounter (Signed)
 Refills has been sent to the pharmacy.

## 2024-07-05 NOTE — Telephone Encounter (Signed)
" °*  STAT* If patient is at the pharmacy, call can be transferred to refill team.   1. Which medications need to be refilled? (please list name of each medication and dose if known)   carvedilol  (COREG ) 12.5 MG tablet     2. Would you like to learn more about the convenience, safety, & potential cost savings by using the [facility]?   No     3. Are you open to using the [facility] (Type [facility]. ). No    4. Which pharmacy/location (including street and city if local pharmacy) is medication to be sent to? [id number] - [place], [place] - [address]     5. Do they need a 30 day or 90 day supply? 90   Pt is currently out   "

## 2024-07-13 ENCOUNTER — Ambulatory Visit: Admitting: Emergency Medicine

## 2024-09-27 ENCOUNTER — Ambulatory Visit: Admitting: Internal Medicine
